# Patient Record
Sex: Male | Born: 2000 | Race: Black or African American | Hispanic: No | Marital: Single | Smoking: Never smoker
Health system: Southern US, Community
[De-identification: ages and names within clinical notes are randomized; demographics above are authoritative.]

## PROBLEM LIST (undated history)

## (undated) DIAGNOSIS — J45909 Unspecified asthma, uncomplicated: Secondary | ICD-10-CM

## (undated) DIAGNOSIS — Z91010 Allergy to peanuts: Secondary | ICD-10-CM

## (undated) DIAGNOSIS — J302 Other seasonal allergic rhinitis: Secondary | ICD-10-CM

## (undated) HISTORY — PX: TONSILLECTOMY: SUR1361

## (undated) HISTORY — PX: TYMPANOSTOMY TUBE PLACEMENT: SHX32

## (undated) HISTORY — DX: Allergy to peanuts: Z91.010

---
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
## 2019-01-02 ENCOUNTER — Ambulatory Visit: Payer: Medicaid Other | Attending: Internal Medicine

## 2019-01-02 DIAGNOSIS — Z20822 Contact with and (suspected) exposure to covid-19: Secondary | ICD-10-CM

## 2019-01-03 LAB — NOVEL CORONAVIRUS, NAA: SARS-CoV-2, NAA: NOT DETECTED

## 2019-02-07 ENCOUNTER — Ambulatory Visit: Payer: Medicaid Other | Attending: Internal Medicine

## 2019-02-07 DIAGNOSIS — Z20822 Contact with and (suspected) exposure to covid-19: Secondary | ICD-10-CM

## 2019-02-08 LAB — NOVEL CORONAVIRUS, NAA: SARS-CoV-2, NAA: NOT DETECTED

## 2019-02-13 ENCOUNTER — Ambulatory Visit: Payer: Medicaid Other | Attending: Internal Medicine

## 2019-02-13 DIAGNOSIS — Z20822 Contact with and (suspected) exposure to covid-19: Secondary | ICD-10-CM

## 2019-02-14 LAB — NOVEL CORONAVIRUS, NAA: SARS-CoV-2, NAA: DETECTED — AB

## 2019-02-14 NOTE — Progress Notes (Signed)
Your test for COVID-19 was positive ("detected"), meaning that you were infected with the novel coronavirus and could give the germ to others.    Please continue isolation at home, for at least 10 days since the start of your fever/cough/breathlessness and until you have had 24 hours without fever (without taking a fever reducer) and with any cough/breathlessness improving. Use over-the-counter medications for symptoms.  If you have had no symptoms, but were exposed to someone who was positive for COVID-19, you will need to quarantine and self-isolate for 14 days from the date of exposure.    Please continue good preventive care measures, including:  frequent hand-washing, avoid touching your face, cover coughs/sneezes, stay out of crowds and keep a 6 foot distance from others.  Clean hard surfaces touched frequently with disinfectant cleaning products.   Please check in with your primary care provider about your positive test result.  Go to the nearest urgent care or ED for assessment if you have severe breathlessness or severe weakness/fatigue (ex needing new help getting out of bed or to the bathroom).  Members of your household will also need to quarantine for 14 days from the date of your positive test. You may be contacted to discuss possible treatment options, and you may also be contacted by the health department for follow up. Please call [place] at [phone] if you have any questions or concerns.     

## 2019-02-15 ENCOUNTER — Telehealth: Payer: Self-pay | Admitting: *Deleted

## 2019-02-15 ENCOUNTER — Telehealth: Payer: Self-pay | Admitting: Nurse Practitioner

## 2019-02-15 NOTE — Telephone Encounter (Signed)
Pt called about instructions for COVID quarantine; instructions given to pt: Marland Kitchen Instruct the patient to remain in self-quarantine until they meet the "Non-Test Criteria for Ending Self-Isolation". Non-Test Criteria for Ending Self-Isolation All persons with fever and respiratory symptoms should isolate themselves until ALL conditions listed below are met: - at least 10 days since symptoms onset - AND 3 consecutive days fever free without antipyretics (acetaminophen [Tylenol] or ibuprofen [Advil]) - AND improvement in respiratory symptoms he verbalized understanding.

## 2019-02-15 NOTE — Telephone Encounter (Signed)
Called to discuss with Edward Cervantes about Covid symptoms and the use of bamlanivimab, a monoclonal antibody infusion for those with mild to moderate Covid symptoms and at a high risk of hospitalization.    Unable to reach patient.   Per chart review Pt is not qualified for this infusion due to lack of identified risk factors and co-morbid conditions.   Patient Active Problem List   Diagnosis Date Noted  . Allergic conjunctivitis 11/01/2016  . Asthma with acute exacerbation 09/14/2014  . Allergic rhinitis 09/14/2014  . 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 Cousar

## 2019-04-22 ENCOUNTER — Other Ambulatory Visit: Payer: Self-pay | Admitting: Allergy and Immunology

## 2019-04-23 ENCOUNTER — Other Ambulatory Visit: Payer: Self-pay

## 2019-04-23 MED ORDER — OLOPATADINE HCL 0.2 % OP SOLN: 1.0000 [drp] | OPHTHALMIC | 2 refills | Status: DC

## 2019-04-24 ENCOUNTER — Telehealth: Payer: Self-pay | Admitting: *Deleted

## 2019-04-24 NOTE — Telephone Encounter (Signed)
Pharmacy sent fax stating that Edward Cervantes is now OTC and Medicaid does not cover anymore. Preferred alternatives are Cromolyn or generic Pataday. Please advise change in medication. Thank You.

## 2019-04-25 MED ORDER — OLOPATADINE HCL 0.2 % OP SOLN: 1.0000 [drp] | Freq: Two times a day (BID) | OPHTHALMIC | 5 refills | Status: DC | PRN

## 2019-04-25 NOTE — Telephone Encounter (Signed)
New prescription has been sent in. Called patient and informed of change in medication. Patient verbalized understanding.

## 2019-04-25 NOTE — Telephone Encounter (Signed)
generic Pataday

## 2019-04-25 NOTE — Addendum Note (Signed)
Addended by: Dollene Cleveland R on: 04/25/2019 09:19 AM   Modules accepted: Orders

## 2019-07-31 IMAGING — DX DG ABDOMEN 1V
1 series · 1 of 1 positions shown · non-contrast
Comparison: None.

CLINICAL DATA: Generalized abdominal pain and bloating over the
last 3 days.

EXAM:
ABDOMEN - 1 VIEW

[abdomen kub]
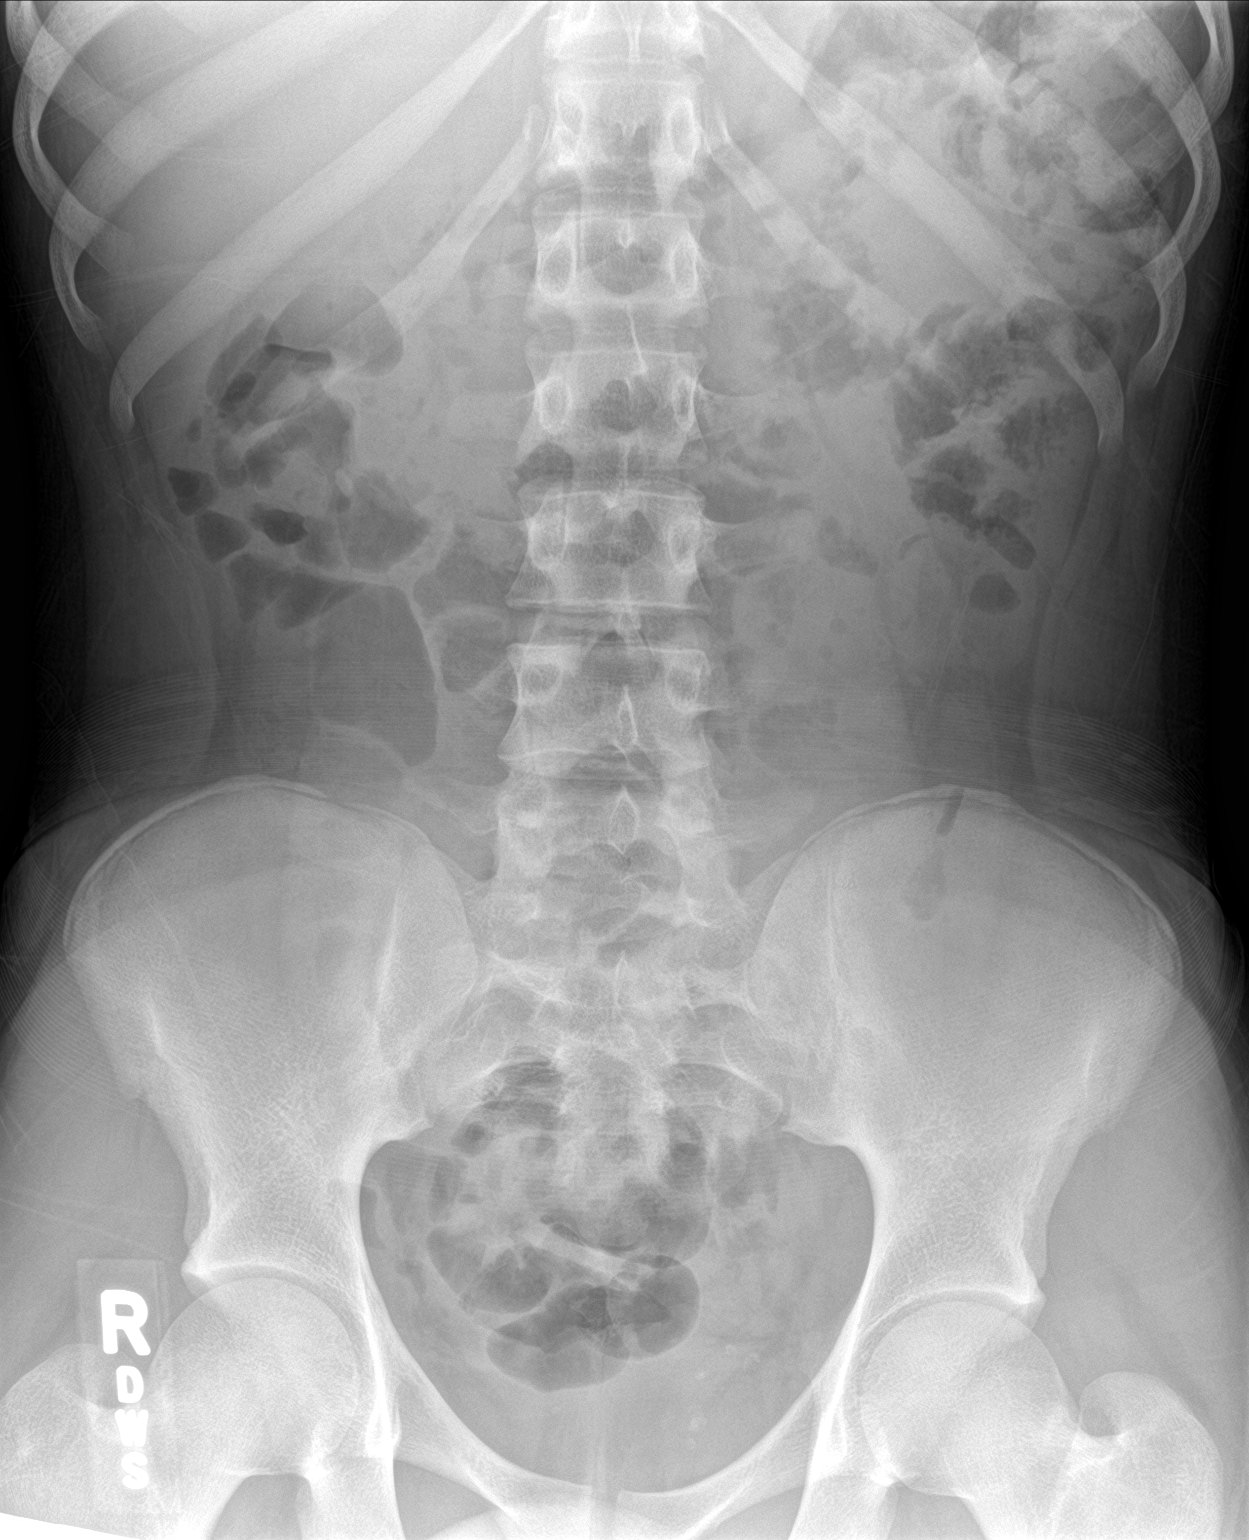

[1 of 1 positions shown; findings below may reference images not displayed]

FINDINGS: Bowel gas pattern is normal without evidence of increased gas or
stool. No obstructive pattern. No abnormal calcifications or bone
findings.
IMPRESSION: Within normal limits. Normal gas pattern. No increased stool volume.

## 2020-08-04 ENCOUNTER — Other Ambulatory Visit: Payer: Self-pay

## 2020-08-04 ENCOUNTER — Emergency Department (HOSPITAL_COMMUNITY)
Admission: EM | Admit: 2020-08-04 | Discharge: 2020-08-04 | Disposition: A | Discharge status: Home / Self Care | Payer: Medicaid Other | Attending: Emergency Medicine | Admitting: Emergency Medicine

## 2020-08-04 ENCOUNTER — Emergency Department (HOSPITAL_COMMUNITY): Payer: Medicaid Other

## 2020-08-04 ENCOUNTER — Encounter (HOSPITAL_COMMUNITY): Payer: Self-pay

## 2020-08-04 DIAGNOSIS — Z7951 Long term (current) use of inhaled steroids: Secondary | ICD-10-CM | POA: Diagnosis not present

## 2020-08-04 DIAGNOSIS — J45909 Unspecified asthma, uncomplicated: Secondary | ICD-10-CM | POA: Insufficient documentation

## 2020-08-04 DIAGNOSIS — Z91010 Allergy to peanuts: Secondary | ICD-10-CM | POA: Diagnosis not present

## 2020-08-04 DIAGNOSIS — G44209 Tension-type headache, unspecified, not intractable: Secondary | ICD-10-CM | POA: Insufficient documentation

## 2020-08-04 DIAGNOSIS — R519 Headache, unspecified: Secondary | ICD-10-CM | POA: Diagnosis present

## 2020-08-04 MED ORDER — PROCHLORPERAZINE MALEATE 10 MG PO TABS
10.0000 mg | ORAL_TABLET | Freq: Once | ORAL | Status: AC
  Administered 2020-08-04: 10 mg via ORAL
  Filled 2020-08-04: qty 1

## 2020-08-04 MED ORDER — DIPHENHYDRAMINE HCL 25 MG PO CAPS
25.0000 mg | ORAL_CAPSULE | Freq: Once | ORAL | Status: AC
  Administered 2020-08-04: 25 mg via ORAL
  Filled 2020-08-04: qty 1

## 2020-08-04 MED ORDER — KETOROLAC TROMETHAMINE 30 MG/ML IJ SOLN: 30.0000 mg | Freq: Once | INTRAMUSCULAR | Status: DC

## 2020-08-04 MED ORDER — KETOROLAC TROMETHAMINE 30 MG/ML IJ SOLN
30.0000 mg | Freq: Once | INTRAMUSCULAR | Status: AC
  Administered 2020-08-04: 30 mg via INTRAMUSCULAR
  Filled 2020-08-04: qty 1

## 2020-08-04 NOTE — ED Provider Notes (Signed)
[facility]-EMERGENCY DEPT Provider Note   CSN: [id number] Arrival date & time: 08/04/20  1455     History Chief Complaint  Patient presents with   Headache   Emesis    Edward Cervantes is a 20 y.o. male.  Presenting to ER with concern for headache.  Patient reports that around noon today he started having a headache as well as felt like his vision was somewhat blurry.  The vision complaint is gone away but still has had persistent headache.  Also had an episode where he had some abdominal pain and nausea and vomiting.  No further nausea, no ongoing abdominal pain at present.  Headache is currently 6 out of 10 in severity frontal, achy, nonradiating.  Not worst headache of his life, not sudden onset.    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 Authorizing Provider  albuterol (PROAIR HFA) 108 (90 Base) MCG/ACT inhaler INHALE 2 PUFFS EVERY 6 HOURS AS NEEDED FOR COUGH OR WHEEZE. 11/08/17   Kozlow, Alvira Philips, MD  cetirizine (ZYRTEC) 10 MG tablet TAKE 1 TABLET BY MOUTH EVERY DAY 12/04/18   Kozlow, Alvira Philips, MD  EPINEPHrine 0.3 mg/0.3 mL IJ SOAJ injection Use as directed for  life-threatening allergic reaction. 11/08/17   Kozlow, Alvira Philips, MD  FLOVENT HFA 44 MCG/ACT inhaler INHALE 3 PUFFS 3 TIMES PER DAY DURING ASTHMA FLARE. RINSE, GARGLE, SPIT AFTER USE. 04/23/19   Kozlow, Alvira Philips, MD  FOCALIN XR 20 MG 24 hr capsule Take 20 mg by mouth every morning. 01/20/15   [provider]  Multiple Vitamins-Minerals (DAILY MULTIVITAMIN PO) Take by mouth daily.    [provider]  Olopatadine HCl (PATADAY) 0.2 % SOLN Place 1 drop into both eyes 1 day or 1 dose. 04/23/19   Kozlow, Alvira Philips, MD  Olopatadine HCl 0.2 % SOLN Apply 1 drop to eye 2 (two) times daily as needed. 04/25/19   Kozlow, Alvira Philips, MD  Triamcinolone Acetonide (NASACORT AQ NA) Place 1 spray into the nose 3 (three) times a week.    [provider]    Allergies    Peanut-containing drug products  Review of Systems   Review of Systems  Physical Exam Updated Vital Signs BP 123/79   Pulse (!) 52   Temp 98.3 F (36.8 C) (Oral)   Resp 18   Ht 6\' 2"  (1.88 m)   Wt 74.8 kg   SpO2 98%   BMI 21.18 kg/m   Physical Exam Vitals  and nursing note reviewed.  Constitutional:      Appearance: He is well-developed.  HENT:     Head: Normocephalic and atraumatic.  Eyes:     Conjunctiva/sclera: Conjunctivae normal.  Cardiovascular:     Rate and Rhythm: Normal rate and regular rhythm.     Heart sounds: No murmur heard. Pulmonary:     Effort: Pulmonary effort is normal. No respiratory distress.  Abdominal:     Palpations: Abdomen is soft.     Tenderness: There is no abdominal tenderness.  Musculoskeletal:     Cervical back: Neck supple.  Skin:    General: Skin is warm and dry.  Neurological:     Mental Status: He is alert.     Comments: AAOx3 CN 2-12 intact, speech clear visual fields intact 5/5 strength in b/l UE and LE Sensation to light touch intact in b/l UE and LE Normal FNF Normal gait  Psychiatric:        Mood and Affect: Mood normal.    ED Results / Procedures / Treatments    Labs (all labs ordered are listed, but only abnormal results are displayed) Labs Reviewed - No data to display  EKG None  Radiology CT HEAD WO CONTRAST ( )  Result Date: 08/04/2020 CLINICAL DATA:  Headache EXAM: CT HEAD WITHOUT CONTRAST TECHNIQUE: Contiguous axial images were obtained from the base of the skull through the vertex without intravenous contrast. COMPARISON:  None. FINDINGS: Brain: There is no mass, hemorrhage or extra-axial collection. The size and configuration of the ventricles and extra-axial CSF spaces are normal. The brain parenchyma is normal, without acute or chronic infarction. Vascular: No abnormal hyperdensity of the major intracranial arteries or dural venous sinuses. No intracranial atherosclerosis. Skull: The visualized skull base, calvarium and extracranial soft tissues are normal. Sinuses/Orbits: No fluid levels or advanced mucosal thickening of the visualized paranasal sinuses. No mastoid or middle ear effusion. The orbits are normal. IMPRESSION: Normal head CT. Electronically Signed   By: Deatra Robinson M.D.   On: 08/04/2020 20:57    Procedures Procedures   Medications Ordered in ED Medications  diphenhydrAMINE (BENADRYL) capsule 25 mg (has no administration in time range)  prochlorperazine (COMPAZINE) tablet 10 mg (has no administration in time range)  ketorolac (TORADOL) 30 MG/ML injection 30 mg (has no administration in time range)    ED Course  I have reviewed the triage vital signs and the nursing notes.  Pertinent labs & imaging results that were available during my care of the patient were reviewed by me and considered in my medical decision making (see chart for details).    MDM Rules/Calculators/A&P                           20 year old male presents to ER with concern for new onset headache.  On exam he appears well in no distress.  Normal neuro exam.  Not sudden onset but given patient does not frequently come to ER and his reported  transient visual disturbance, and transient nausea, check CT head.  Negative for acute pathology.  On reassessment he remains well-appearing.  Suspect tension type headache versus migraine.  Reviewed return precautions and discharged.    After the discussed management above, the patient was determined to be safe for discharge.  The patient was in agreement with this plan and all questions regarding their care were answered.  ED return precautions were discussed and the patient will return to the ED with any  significant worsening of condition.  Final Clinical Impression(s) / ED Diagnoses Final diagnoses:  Tension-type headache, not intractable, unspecified chronicity pattern    Rx / DC Orders ED Discharge Orders     None        Milagros Loll, MD 08/04/20 2111

## 2020-08-04 NOTE — ED Provider Notes (Signed)
Emergency Medicine Provider Triage Evaluation Note  Edward Cervantes , a 20 y.o. male  was evaluated in triage.  Pt complains of headache that started at 12 today. Associated with nv. Has hx headaches.  Review of Systems  Positive: Headache, nv Negative: Numbness/weakness  Physical Exam  BP 124/87 (BP Location: Left Arm)   Pulse (!) 58   Temp 98.1 F (36.7 C) (Oral)   Resp 18   Ht 6\' 2"  (1.88 m)   Wt 74.8 kg   SpO2 100%   BMI 21.18 kg/m  Gen:   Awake, no distress   Resp:  Normal effort  MSK:   Moves extremities without difficulty  Other:  Cn II-XII intact. 5/5 strength to bue/ble  Medical Decision Making  Medically screening exam initiated at 4:16 PM.  Appropriate orders placed.  was informed that the remainder of the evaluation will be completed by another provider, this initial triage assessment does not replace that evaluation, and the importance of remaining in the ED until their evaluation is complete.     [name], PA-C 08/04/20 1619    10/04/20, MD 08/04/20 10/04/20

## 2020-08-04 NOTE — ED Triage Notes (Signed)
Pt reports headache that started today at 12 noon. Pt reports taking tylenol. After taking medication pt reports his stomach started to hurt. Pt reports vomiting x3.   Pt reports his stomach is not hurting right now.

## 2020-08-04 NOTE — Discharge Instructions (Addendum)
Recommend Tylenol and Motrin as needed for pain control.  Follow-up with your primary care doctor.  Come back to ER if you develop significant worsening of your headache, vomiting, fever, neck pain or stiffness or other new concerning symptoms.

## 2020-12-23 ENCOUNTER — Emergency Department (HOSPITAL_COMMUNITY)
Admission: EM | Admit: 2020-12-23 | Discharge: 2020-12-23 | Disposition: A | Discharge status: Home / Self Care | Payer: Medicaid Other | Attending: Emergency Medicine | Admitting: Emergency Medicine

## 2020-12-23 ENCOUNTER — Encounter (HOSPITAL_COMMUNITY): Payer: Self-pay

## 2020-12-23 ENCOUNTER — Other Ambulatory Visit: Payer: Self-pay

## 2020-12-23 DIAGNOSIS — Z20822 Contact with and (suspected) exposure to covid-19: Secondary | ICD-10-CM | POA: Diagnosis not present

## 2020-12-23 DIAGNOSIS — J101 Influenza due to other identified influenza virus with other respiratory manifestations: Secondary | ICD-10-CM | POA: Diagnosis not present

## 2020-12-23 DIAGNOSIS — J111 Influenza due to unidentified influenza virus with other respiratory manifestations: Secondary | ICD-10-CM

## 2020-12-23 DIAGNOSIS — Z7951 Long term (current) use of inhaled steroids: Secondary | ICD-10-CM | POA: Insufficient documentation

## 2020-12-23 DIAGNOSIS — Z91010 Allergy to peanuts: Secondary | ICD-10-CM | POA: Insufficient documentation

## 2020-12-23 DIAGNOSIS — J45909 Unspecified asthma, uncomplicated: Secondary | ICD-10-CM | POA: Diagnosis not present

## 2020-12-23 DIAGNOSIS — R519 Headache, unspecified: Secondary | ICD-10-CM | POA: Diagnosis present

## 2020-12-23 LAB — RESP PANEL BY RT-PCR (FLU A&B, COVID) ARPGX2
Influenza A by PCR: POSITIVE — AB
Influenza B by PCR: NEGATIVE
SARS Coronavirus 2 by RT PCR: NEGATIVE

## 2020-12-23 MED ORDER — IBUPROFEN 800 MG PO TABS
800.0000 mg | ORAL_TABLET | Freq: Once | ORAL | Status: AC
  Administered 2020-12-23: 21:00:00 800 mg via ORAL
  Filled 2020-12-23: qty 1

## 2020-12-23 MED ORDER — BENZONATATE 100 MG PO CAPS: 100.0000 mg | ORAL_CAPSULE | Freq: Three times a day (TID) | ORAL | 0 refills | Status: DC

## 2020-12-23 MED ORDER — FLUTICASONE PROPIONATE 50 MCG/ACT NA SUSP: 2.0000 | Freq: Every day | NASAL | 0 refills | Status: DC

## 2020-12-23 MED ORDER — NAPROXEN 500 MG PO TABS: 500.0000 mg | ORAL_TABLET | Freq: Two times a day (BID) | ORAL | 0 refills | Status: DC

## 2020-12-23 MED ORDER — OSELTAMIVIR PHOSPHATE 75 MG PO CAPS: 75.0000 mg | ORAL_CAPSULE | Freq: Two times a day (BID) | ORAL | 0 refills | Status: DC

## 2020-12-23 NOTE — ED Triage Notes (Signed)
Pt reports with headache and generalized body aches since today.

## 2020-12-23 NOTE — ED Provider Notes (Signed)
[facility]-EMERGENCY DEPT Provider Note   CSN: [id number] Arrival date & time: 12/23/20  1922     History Chief Complaint  Patient presents with   Headache   Generalized Body Aches    Edward Cervantes is a 20 y.o. male with no significant past medical history who presents for evaluation of myalgias, headache and rhinorrhea.  Began this morning.  No known sick contacts.  No recent head trauma.  Feels like he is "aching" all over.  No congestion.  Has some clear rhinorrhea.  No chest pain, shortness of breath, cough abdominal pain, nausea, vomiting.  No neck stiffness or neck rigidity.  Has not taken anything for symptoms.  Denies additional aggravating or alleviating factors.  History obtained from patient and past medical records.  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 Authorizing Provider  benzonatate (TESSALON) 100 MG capsule Take 1 capsule (100 mg total) by mouth every 8 (eight) hours. 12/23/20  Yes Raimundo Corbit A, PA-C  fluticasone (FLONASE) 50 MCG/ACT nasal spray Place 2 sprays into both nostrils daily. 12/23/20  Yes Joellen Tullos,  Harriette Tovey A, PA-C  naproxen (NAPROSYN) 500 MG tablet Take 1 tablet (500 mg total) by mouth 2 (two) times daily. 12/23/20  Yes Darrold Bezek A, PA-C  albuterol (PROAIR HFA) 108 (90 Base) MCG/ACT inhaler INHALE 2 PUFFS EVERY 6 HOURS AS NEEDED FOR COUGH OR WHEEZE. 11/08/17   Kozlow, Alvira Philips, MD  cetirizine (ZYRTEC) 10 MG tablet TAKE 1 TABLET BY MOUTH EVERY DAY 12/04/18   Kozlow, Alvira Philips, MD  EPINEPHrine 0.3 mg/0.3 mL IJ SOAJ injection Use as directed for life-threatening allergic reaction. 11/08/17   Kozlow, Alvira Philips, MD  FLOVENT HFA 44 MCG/ACT inhaler INHALE 3 PUFFS 3 TIMES PER DAY DURING ASTHMA FLARE. RINSE, GARGLE, SPIT AFTER USE. 04/23/19   Kozlow, Alvira Philips, MD  FOCALIN XR 20 MG 24 hr capsule Take 20 mg by mouth every morning. 01/20/15   [provider]  Multiple Vitamins-Minerals (DAILY MULTIVITAMIN PO) Take by mouth daily.    [provider]  Olopatadine HCl (PATADAY) 0.2 % SOLN Place 1 drop into both eyes 1 day or 1 dose. 04/23/19   Kozlow, Alvira Philips, MD  Olopatadine HCl 0.2 % SOLN Apply 1 drop to eye 2 (two) times daily as needed. 04/25/19   Kozlow, Alvira Philips, MD  Triamcinolone Acetonide (NASACORT AQ NA) Place 1 spray into the nose 3 (three) times a week.    [provider]    Allergies    Peanut-containing drug products  Review of Systems   Review of Systems  Constitutional:  Positive for activity change, chills, fatigue and fever.  HENT:  Positive for postnasal drip and rhinorrhea. Negative for congestion, sinus pressure, sinus pain, sore throat, trouble swallowing and voice change.   Respiratory: Negative.    Cardiovascular: Negative.   Gastrointestinal: Negative.   Musculoskeletal:  Positive for myalgias.  Skin: Negative.   Neurological:  Positive for headaches. Negative for dizziness, seizures, syncope, speech difficulty, weakness, light-headedness and numbness.  All other systems reviewed and are negative.  Physical Exam Updated Vital Signs BP 109/65 (BP Location:  Left Arm)    Pulse (!) 103    Temp 100.2 F (37.9 C) (Oral)    Resp 17    Ht 6\' 2"  (1.88 m)    Wt 75.3 kg    SpO2 99%    BMI 21.31 kg/m   Physical Exam Vitals and nursing note reviewed.  Constitutional:      General: He is not in acute distress.    Appearance: He is not ill-appearing, toxic-appearing or diaphoretic.  HENT:     Head: Normocephalic and atraumatic.     Jaw: There is normal jaw occlusion.     Right Ear: Tympanic membrane, ear canal and external ear normal. There is no impacted cerumen. No hemotympanum. Tympanic membrane is not injected, scarred, perforated, erythematous, retracted or bulging.     Left Ear: Tympanic membrane, ear canal and external ear normal. There is no impacted cerumen. No hemotympanum. Tympanic membrane is not injected, scarred, perforated, erythematous, retracted or bulging.     Ears:     Comments: No Mastoid tenderness.    Nose:     Comments: Clear rhinorrhea and congestion to bilateral nares.  No sinus tenderness.    Mouth/Throat:     Comments: Posterior oropharynx clear.  Mucous membranes moist.  Tonsils without erythema or exudate.  Uvula midline without deviation.  No evidence of PTA or RPA.  No drooling, dysphasia or trismus.  Phonation normal. Neck:     Trachea: Trachea and phonation normal.     Meningeal: Brudzinski's sign and Kernig's sign absent.     Comments: No Neck stiffness or neck rigidity.  No meningismus.  No cervical lymphadenopathy. Cardiovascular:     Comments: No murmurs rubs or gallops. Pulmonary:     Comments: Clear to auscultation bilaterally without wheeze, rhonchi or rales.  No accessory muscle usage.  Able speak in full sentences. Abdominal:     Comments: Soft, nontender without rebound or guarding.  No CVA tenderness.  Musculoskeletal:     Comments: Moves all 4 extremities without difficulty.  Lower extremities without edema, erythema or warmth.  Skin:    Comments: Brisk capillary refill.  No rashes or lesions.   Neurological:     Mental Status: He is alert.     Comments: Ambulatory in department without difficulty.  Cranial nerves II through XII grossly intact.  No facial droop.  No aphasia.    ED Results / Procedures / Treatments   Labs (all labs ordered are listed, but only abnormal results are displayed) Labs Reviewed  RESP PANEL BY RT-PCR (FLU A&B, COVID) ARPGX2 - Abnormal; Notable for the following components:      Result Value   Influenza A by PCR POSITIVE (*)    All other components within normal limits    EKG None  Radiology No results found.  Procedures Procedures  Medications Ordered in ED Medications  ibuprofen (ADVIL) tablet 800 mg (800 mg Oral Given 12/23/20 2038)    ED Course  I have reviewed the triage vital signs and the nursing notes.  Pertinent labs & imaging results that were available during my care of the patient were reviewed by me and considered in my medical decision making (see chart for details).  Pleasant 20 year old here for evaluation of viral symptoms.  On arrival febrile however does not appear septic or ill.  He has nonfocal neuro exam without deficit.  No neck stiffness or neck rigidity.  He has no meningismus.  He appears clinically well-hydrated. Tolerating PO intake. PO clear. His heart and lungs are clear.  Abdomen soft, nontender.   Influenza A positive  Likely cause of his symptoms.  DC home symptomatic management  The patient has been appropriately medically screened and/or stabilized in the ED. I have low suspicion for any other emergent medical condition which would require further screening, evaluation or treatment in the ED or require inpatient management.  Patient is hemodynamically stable and in no acute distress.  Patient able to ambulate in department prior to ED.  Evaluation does not show acute pathology that would require ongoing or additional emergent interventions while in the emergency department or further inpatient  treatment.  I have discussed the diagnosis with the patient and answered all questions.  Pain is been managed while in the emergency department and patient has no further complaints prior to discharge.  Patient is comfortable with plan discussed in room and is stable for discharge at this time.  I have discussed strict return precautions for returning to the emergency department.  Patient was encouraged to follow-up with PCP/specialist refer to at discharge.     MDM Rules/Calculators/A&P                             Final Clinical Impression(s) / ED Diagnoses Final diagnoses:  Influenza    Rx / DC Orders ED Discharge Orders          Ordered    naproxen (NAPROSYN) 500 MG tablet  2 times daily        12/23/20 2108    benzonatate (TESSALON) 100 MG capsule  Every 8 hours        12/23/20 2108    fluticasone (FLONASE) 50 MCG/ACT nasal spray  Daily        12/23/20 2108             Lenny Bouchillon A, PA-C 12/23/20 2114    Pollyann Savoy, MD 12/23/20 2202

## 2020-12-23 NOTE — Discharge Instructions (Addendum)
Your flu test was positive.  I have written you for a few medications to help with your symptoms  Naproxen which will help with your fever as well as your body aches and headache Flonase if you developing runny nose Tessalon Pearls if you develop a cough May also take additional Tylenol  Need to be out of work until you are 48 hours without fever  Return for new or worsening symptoms

## 2020-12-23 NOTE — ED Notes (Signed)
Pt A&OX4 ambulatory at d/c with independent steady gait, NAD. Pt verbalized understanding of d/c instructions, prescriptions and follow up care. 

## 2021-07-27 ENCOUNTER — Other Ambulatory Visit: Payer: Self-pay

## 2021-07-27 ENCOUNTER — Emergency Department (HOSPITAL_COMMUNITY)
Admission: EM | Admit: 2021-07-27 | Discharge: 2021-07-27 | Disposition: A | Discharge status: Home / Self Care | Payer: Medicaid Other | Attending: Emergency Medicine | Admitting: Emergency Medicine

## 2021-07-27 ENCOUNTER — Encounter (HOSPITAL_COMMUNITY): Payer: Self-pay

## 2021-07-27 DIAGNOSIS — X04XXXA Exposure to ignition of highly flammable material, initial encounter: Secondary | ICD-10-CM | POA: Diagnosis not present

## 2021-07-27 DIAGNOSIS — T24102A Burn of first degree of unspecified site of left lower limb, except ankle and foot, initial encounter: Secondary | ICD-10-CM | POA: Insufficient documentation

## 2021-07-27 DIAGNOSIS — T310 Burns involving less than 10% of body surface: Secondary | ICD-10-CM | POA: Diagnosis not present

## 2021-07-27 DIAGNOSIS — T24231A Burn of second degree of right lower leg, initial encounter: Secondary | ICD-10-CM | POA: Diagnosis present

## 2021-07-27 DIAGNOSIS — T24201A Burn of second degree of unspecified site of right lower limb, except ankle and foot, initial encounter: Secondary | ICD-10-CM

## 2021-07-27 MED ORDER — SILVER SULFADIAZINE 1 % EX CREA
TOPICAL_CREAM | Freq: Once | CUTANEOUS | Status: AC
  Filled 2021-07-27: qty 50
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
## 2021-07-27 NOTE — ED Notes (Signed)
I provided reinforced discharge education based off of discharge summary/care provided. Pt acknowledged and understood my education. Pt had no further questions/concerns for provider/myself.   
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
## 2021-11-28 ENCOUNTER — Emergency Department (HOSPITAL_COMMUNITY): Payer: Medicaid Other

## 2021-11-28 ENCOUNTER — Emergency Department (HOSPITAL_COMMUNITY)
Admission: EM | Admit: 2021-11-28 | Discharge: 2021-11-28 | Disposition: A | Discharge status: Home / Self Care | Payer: Medicaid Other | Attending: Student | Admitting: Student

## 2021-11-28 DIAGNOSIS — Y92410 Unspecified street and highway as the place of occurrence of the external cause: Secondary | ICD-10-CM | POA: Insufficient documentation

## 2021-11-28 DIAGNOSIS — R519 Headache, unspecified: Secondary | ICD-10-CM | POA: Diagnosis not present

## 2021-11-28 DIAGNOSIS — M25561 Pain in right knee: Secondary | ICD-10-CM | POA: Diagnosis present

## 2021-11-28 DIAGNOSIS — R0781 Pleurodynia: Secondary | ICD-10-CM | POA: Insufficient documentation

## 2021-11-28 MED ORDER — IBUPROFEN 800 MG PO TABS
800.0000 mg | ORAL_TABLET | Freq: Once | ORAL | Status: AC
  Administered 2021-11-28: 800 mg via ORAL
  Filled 2021-11-28: qty 1

## 2021-11-28 MED ORDER — METHOCARBAMOL 500 MG PO TABS: 500.0000 mg | ORAL_TABLET | Freq: Two times a day (BID) | ORAL | 0 refills | Status: DC

## 2021-11-28 MED ORDER — CYCLOBENZAPRINE HCL 10 MG PO TABS
10.0000 mg | ORAL_TABLET | Freq: Once | ORAL | Status: AC
  Administered 2021-11-28: 10 mg via ORAL
  Filled 2021-11-28: qty 1

## 2021-11-28 NOTE — Discharge Instructions (Signed)
Robaxin as needed as prescribed for muscle aches.  Do not drive or operate machinery while taking this medication.  Can take Motrin and Tylenol as needed as directed for pain as well.  Can apply warm compresses to sore muscles for 20 minutes at a time and follow with gentle stretching.  Also recommend topical lidocaine patches as needed, these are available over-the-counter.  Follow-up with your primary care provider for recheck if pain is not improving in the next 3 to 5 days.
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
## 2021-11-28 NOTE — ED Notes (Signed)
Patient verbalizes understanding of discharge instructions. Opportunity for questioning and answers were provided. Armband removed by staff, pt discharged from ED. Ambulated out to lobby with mother  

## 2022-02-01 ENCOUNTER — Ambulatory Visit (HOSPITAL_COMMUNITY)
Admission: EM | Admit: 2022-02-01 | Discharge: 2022-02-01 | Disposition: A | Discharge status: Home / Self Care | Payer: Medicaid Other | Attending: Internal Medicine | Admitting: Internal Medicine

## 2022-02-01 ENCOUNTER — Encounter (HOSPITAL_COMMUNITY): Payer: Self-pay | Admitting: Emergency Medicine

## 2022-02-01 DIAGNOSIS — J069 Acute upper respiratory infection, unspecified: Secondary | ICD-10-CM

## 2022-02-01 MED ORDER — IBUPROFEN 600 MG PO TABS: 600.0000 mg | ORAL_TABLET | Freq: Four times a day (QID) | ORAL | 0 refills | Status: AC | PRN

## 2022-02-01 MED ORDER — FLUTICASONE PROPIONATE 50 MCG/ACT NA SUSP: 1.0000 | Freq: Every day | NASAL | 0 refills | Status: AC

## 2022-02-01 NOTE — ED Triage Notes (Signed)
Pt reports took two flu shots (had one in November in [employer]) and then again last Thursday had another flu shot since couldn't get records for a job. Since Thursday had body aches, cough, congestion., sneeze, sinus pressure. Took tylenol and nyquil, drinking tea.

## 2022-02-01 NOTE — Discharge Instructions (Addendum)
Please maintain adequate hydration Take medications as prescribed No indication for COVID testing given the duration of your symptoms Please return to urgent care if you have any other concerns.

## 2022-02-01 NOTE — ED Provider Notes (Signed)
[place]    CSN: [id number] Arrival date & time: 02/01/22  6283      History   Chief Complaint No chief complaint on file.   HPI Edward Cervantes is a 22 y.o. male comes to urgent care for persistent nasal congestion and postnasal drainage after he received a flu shot.  Patient has had 2 flu shots separated by 3 weeks recently.  Following the second shot he developed nasal congestion, headache, generalized body aches, subjective fever and chills.  Patient also had cough with postnasal drainage.  Most of his symptoms except nasal congestion and postnasal drainage have subsided.  He has been taking over-the-counter medication and maintaining adequate hydration to help with symptoms.  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 Authorizing Provider  fluticasone (FLONASE) 50 MCG/ACT nasal spray Place 1 spray into both nostrils daily. 02/01/22  Yes Nthony Lefferts, Myrene Galas, MD  ibuprofen (ADVIL) 600 MG tablet Take 1 tablet (600 mg total) by mouth every 6 (six) hours as needed. 02/01/22  Yes Toddrick Sanna, Myrene Galas, MD  albuterol (PROAIR HFA) 108 (90 Base) MCG/ACT inhaler INHALE 2 PUFFS EVERY 6 HOURS AS NEEDED FOR COUGH OR WHEEZE. 11/08/17   Kozlow, Donnamarie Poag, MD  cetirizine (ZYRTEC) 10 MG tablet TAKE 1 TABLET BY MOUTH EVERY DAY 12/04/18   Kozlow, Donnamarie Poag, MD  EPINEPHrine 0.3 mg/0.3 mL IJ SOAJ injection Use as directed for life-threatening allergic reaction.  11/08/17   Kozlow, Donnamarie Poag, MD  FLOVENT HFA 44 MCG/ACT inhaler INHALE 3 PUFFS 3 TIMES PER DAY DURING ASTHMA FLARE. RINSE, GARGLE, SPIT AFTER USE. 04/23/19   Kozlow, Donnamarie Poag, MD  FOCALIN XR 20 MG 24 hr capsule Take 20 mg by mouth every morning. 01/20/15   [provider]  Multiple Vitamins-Minerals (DAILY MULTIVITAMIN PO) Take by mouth daily.    [provider]    Family History Family History  Problem Relation Age of Onset   Hypertension Other    Diabetes Other    Asthma Other    Healthy Father     Social History Social History   Tobacco Use   Smoking status: Never   Smokeless tobacco: Never  Vaping Use   Vaping Use: Never used  Substance Use Topics   Alcohol use: No   Drug use: No     Allergies   Peanut-containing drug products   Review of Systems Review of Systems As per HPI  Physical Exam Triage Vital Signs ED Triage Vitals  Enc Vitals Group     BP 02/01/22 1047 102/71     Pulse Rate 02/01/22 1047 67     Resp 02/01/22 1047 14     Temp 02/01/22 1047 97.7 F (36.5 C)     Temp Source 02/01/22 1047 Oral     SpO2 02/01/22 1047 97 %     Weight --  Height --      Head Circumference --      Peak Flow --      Pain Score 02/01/22 1046 4     Pain Loc --      Pain Edu? --      Excl. in [place]? --    No data found.  Updated Vital Signs BP 102/71 (BP Location: Left Arm)   Pulse 67   Temp 97.7 F (36.5 C) (Oral)   Resp 14   SpO2 97%   Visual Acuity Right Eye Distance:   Left Eye Distance:   Bilateral Distance:    Right Eye Near:   Left Eye Near:    Bilateral Near:     Physical Exam Vitals and nursing note reviewed.  Constitutional:      Appearance: He is not ill-appearing.  HENT:     Right Ear: Tympanic membrane normal.     Left Ear: Tympanic membrane normal.     Mouth/Throat:     Mouth: Mucous membranes are moist.     Pharynx: No posterior oropharyngeal erythema.  Cardiovascular:     Rate and Rhythm: Normal rate and regular  rhythm.     Pulses: Normal pulses.     Heart sounds: Normal heart sounds.  Pulmonary:     Effort: Pulmonary effort is normal.     Breath sounds: Normal breath sounds.  Neurological:     Mental Status: He is alert.      UC Treatments / Results  Labs (all labs ordered are listed, but only abnormal results are displayed) Labs Reviewed - No data to display  EKG   Radiology No results found.  Procedures Procedures (including critical care time)  Medications Ordered in UC Medications - No data to display  Initial Impression / Assessment and Plan / UC Course  I have reviewed the triage vital signs and the nursing notes.  Pertinent labs & imaging results that were available during my care of the patient were reviewed by me and considered in my medical decision making (see chart for details).     1.  Viral URI with cough: COVID and flu test not recommended given the duration of symptoms Patient is advised to increase oral fluid intake Fluticasone nasal spray nightly Please return to urgent care if you have persistent symptoms. Final Clinical Impressions(s) / UC Diagnoses   Final diagnoses:  Viral URI with cough     Discharge Instructions      Please maintain adequate hydration Take medications as prescribed No indication for COVID testing given the duration of your symptoms Please return to urgent care if you have any other concerns.   ED Prescriptions     Medication Sig Dispense Auth. Provider   fluticasone (FLONASE) 50 MCG/ACT nasal spray Place 1 spray into both nostrils daily. 16 g Chase Picket, MD   ibuprofen (ADVIL) 600 MG tablet Take 1 tablet (600 mg total) by mouth every 6 (six) hours as needed. 30 tablet Tallan Sandoz, Myrene Galas, MD      PDMP not reviewed this encounter.   Chase Picket, MD 02/01/22 1116

## 2022-08-18 ENCOUNTER — Other Ambulatory Visit: Payer: Self-pay

## 2022-08-18 MED ORDER — DOXYCYCLINE HYCLATE 100 MG PO TABS
100.0000 mg | ORAL_TABLET | Freq: Two times a day (BID) | ORAL | 0 refills | Status: DC
  Filled 2022-08-18: qty 14, 7d supply, fill #0

## 2023-03-24 ENCOUNTER — Encounter (HOSPITAL_COMMUNITY): Payer: Self-pay | Admitting: Emergency Medicine

## 2023-03-24 ENCOUNTER — Other Ambulatory Visit: Payer: Self-pay

## 2023-03-24 ENCOUNTER — Emergency Department (HOSPITAL_COMMUNITY)
Admission: EM | Admit: 2023-03-24 | Discharge: 2023-03-24 | Discharge status: Against Medical Advice | Attending: Emergency Medicine | Admitting: Emergency Medicine

## 2023-03-24 DIAGNOSIS — L989 Disorder of the skin and subcutaneous tissue, unspecified: Secondary | ICD-10-CM | POA: Diagnosis present

## 2023-03-24 DIAGNOSIS — Z91010 Allergy to peanuts: Secondary | ICD-10-CM | POA: Insufficient documentation

## 2023-03-24 MED ORDER — ACETAMINOPHEN 500 MG PO TABS: 1000.0000 mg | ORAL_TABLET | Freq: Once | ORAL | Status: DC

## 2023-03-24 MED ORDER — DOXYCYCLINE HYCLATE 100 MG PO CAPS: 100.0000 mg | ORAL_CAPSULE | Freq: Two times a day (BID) | ORAL | 0 refills | Status: AC

## 2023-03-24 MED ORDER — IBUPROFEN 400 MG PO TABS: 600.0000 mg | ORAL_TABLET | Freq: Once | ORAL | Status: DC

## 2023-03-24 NOTE — Discharge Instructions (Addendum)
 The areas on your body looks like they could have been insect bites or scratches that have gotten infected.  I have sent in doxycycline to help treat this infection.  Be sure to follow-up with your primary doctor if your symptoms are worsening despite the antibiotics.
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
## 2023-03-24 NOTE — ED Triage Notes (Signed)
 Pt c/o possible abscess or insect bite to left shoulder and abdomen, denies fevers

## 2023-03-24 NOTE — ED Provider Notes (Signed)
 [place] EMERGENCY DEPARTMENT AT [facility] Provider Note   CSN: [id number] Arrival date & time: 03/24/23  0547     History  Chief Complaint  Patient presents with   Abscess    Edward Cervantes is a 23 y.o. male.  Patient here with possible abscess or insect bite to the left shoulder and abdomen.  Lesions been present for about 1 week now.  He woke up with them and they were itchy.  He had been scratching them.  Over time, the areas got more swollen and painful.  They are not healing like they should.  Lesion on his left shoulder had pus in it 2 days ago.  No fevers, nausea, vomiting, bowel changes, dysuria, chest pain, shortness of breath, recent travel.  He is sexually active with his girlfriend only.  He does have a history of abscess formation on his arm which required drainage and antibiotics in the past.  He was told that this was due to him shaving.  No known personal or family history of immunosuppression.    Home Medications Prior to Admission medications   Medication Sig Start Date End Date Taking? Authorizing Provider  doxycycline (VIBRAMYCIN) 100 MG capsule Take 1 capsule (100 mg total) by mouth 2 (two) times daily for 7 days. 03/24/23 03/31/23 Yes Irean Kendricks, Earvin Hansen, MD  albuterol (PROAIR HFA) 108 (90 Base) MCG/ACT inhaler INHALE 2 PUFFS EVERY 6 HOURS AS NEEDED FOR COUGH OR WHEEZE. 11/08/17   Kozlow, Alvira Philips, MD  cetirizine (ZYRTEC) 10 MG tablet TAKE 1 TABLET BY MOUTH EVERY DAY 12/04/18   Kozlow, Alvira Philips, MD  EPINEPHrine 0.3 mg/0.3 mL IJ SOAJ injection Use as directed for life-threatening allergic reaction. 11/08/17   Kozlow, Alvira Philips, MD  FLOVENT HFA 44 MCG/ACT inhaler INHALE 3 PUFFS 3 TIMES PER DAY DURING ASTHMA FLARE. RINSE, GARGLE, SPIT AFTER USE. 04/23/19   Kozlow, Alvira Philips, MD  fluticasone (FLONASE) 50 MCG/ACT nasal spray Place 1 spray into both nostrils daily. 02/01/22   Lamptey, Britta Mccreedy, MD  FOCALIN XR 20 MG 24 hr capsule Take 20 mg by mouth every morning. 01/20/15    [provider]  ibuprofen (ADVIL) 600 MG tablet Take 1 tablet (600 mg total) by mouth every 6 (six) hours as needed. 02/01/22   LampteyBritta Mccreedy, MD  Multiple Vitamins-Minerals (DAILY MULTIVITAMIN PO) Take by mouth daily.    [provider]      Allergies    Peanut-containing drug products    Review of Systems   Review of Systems  Constitutional:  Negative for fever.  Skin:  Positive for color change, rash and wound.    Physical Exam Updated Vital Signs BP 123/80 (BP Location: Left Arm)   Pulse 77   Temp 99.1 F (37.3 C)   Resp 18   Ht 6\' 2"  (1.88 m)   Wt 75.3 kg   SpO2 100%   BMI 21.31 kg/m  Physical Exam Constitutional:      General: He is not in acute distress.    Appearance: Normal appearance. He is normal weight. He is not ill-appearing.  HENT:     Head: Normocephalic and atraumatic.     Nose: Nose normal.     Mouth/Throat:     Mouth: Mucous membranes are moist.  Eyes:     Extraocular Movements: Extraocular movements intact.     Conjunctiva/sclera: Conjunctivae normal.  Cardiovascular:     Rate and Rhythm: Normal rate and regular rhythm.     Heart  sounds: Normal heart sounds.  Pulmonary:     Effort: Pulmonary effort is normal.     Breath sounds: Normal breath sounds.  Abdominal:     General: Abdomen is flat. Bowel sounds are normal. There is no distension.     Palpations: Abdomen is soft.     Tenderness: There is no abdominal tenderness.  Musculoskeletal:        General: Normal range of motion.     Cervical back: Normal range of motion.  Skin:    General: Skin is warm.     Comments: Subcentimeter punctate lesion appreciated around navel and left shoulder.  Scabs overlying with surrounding erythema and induration.  No fluctuance.  No purulence expressed.  Moderate pain to palpation of areas.  Neurological:     General: No focal deficit present.     Mental Status: He is alert and oriented to person, place, and time.  Psychiatric:         Mood and Affect: Mood normal.        Behavior: Behavior normal.         ED Results / Procedures / Treatments   Labs (all labs ordered are listed, but only abnormal results are displayed) Labs Reviewed - No data to display  EKG None  Radiology No results found.  Procedures Procedures    Medications Ordered in ED Medications - No data to display  ED Course/ Medical Decision Making/ A&P                                 Medical Decision Making Risk Prescription drug management.   23 year old with history of asthma, allergies, and anaphylaxis to food who presents with concern for abscess. Vitals reassuring. Exam notable for indurated and erythematous subcentimeter lesions on the navel and left shoulder. Differential includes insect bite, superimposed infection/abscess, trauma, drug reaction.  Patient overall well-appearing and is afebrile.  Areas do appear indurated and erythematous; however, no purulence on my exam.  Feel he can be treated as outpatient with doxycycline.  Advised close follow-up with PCP given his history of abscess formation requiring drainage.  Patient agreeable to plan.        Final Clinical Impression(s) / ED Diagnoses Final diagnoses:  Skin lesion    Rx / DC Orders ED Discharge Orders          Ordered    doxycycline (VIBRAMYCIN) 100 MG capsule  2 times daily        03/24/23 0753              Evette Georges, MD 03/24/23 7829    Gerhard Munch, MD 03/24/23 [phone]

## 2023-05-07 ENCOUNTER — Emergency Department (HOSPITAL_COMMUNITY)

## 2023-05-07 ENCOUNTER — Other Ambulatory Visit: Payer: Self-pay

## 2023-05-07 ENCOUNTER — Inpatient Hospital Stay (HOSPITAL_COMMUNITY)
Admission: EM | Admit: 2023-05-07 | Discharge: 2023-05-08 | DRG: 199 | Disposition: A | Discharge status: Home / Self Care | Attending: Internal Medicine | Admitting: Internal Medicine

## 2023-05-07 ENCOUNTER — Encounter (HOSPITAL_COMMUNITY): Payer: Self-pay

## 2023-05-07 DIAGNOSIS — J45901 Unspecified asthma with (acute) exacerbation: Secondary | ICD-10-CM | POA: Diagnosis present

## 2023-05-07 DIAGNOSIS — B348 Other viral infections of unspecified site: Secondary | ICD-10-CM | POA: Diagnosis not present

## 2023-05-07 DIAGNOSIS — J4521 Mild intermittent asthma with (acute) exacerbation: Secondary | ICD-10-CM | POA: Diagnosis present

## 2023-05-07 DIAGNOSIS — R0789 Other chest pain: Secondary | ICD-10-CM | POA: Diagnosis present

## 2023-05-07 DIAGNOSIS — Z8709 Personal history of other diseases of the respiratory system: Secondary | ICD-10-CM | POA: Diagnosis not present

## 2023-05-07 DIAGNOSIS — J9311 Primary spontaneous pneumothorax: Principal | ICD-10-CM | POA: Diagnosis present

## 2023-05-07 DIAGNOSIS — Z833 Family history of diabetes mellitus: Secondary | ICD-10-CM

## 2023-05-07 DIAGNOSIS — J939 Pneumothorax, unspecified: Secondary | ICD-10-CM | POA: Diagnosis present

## 2023-05-07 DIAGNOSIS — B9710 Unspecified enterovirus as the cause of diseases classified elsewhere: Secondary | ICD-10-CM | POA: Diagnosis present

## 2023-05-07 DIAGNOSIS — Z79899 Other long term (current) drug therapy: Secondary | ICD-10-CM

## 2023-05-07 DIAGNOSIS — J3089 Other allergic rhinitis: Secondary | ICD-10-CM | POA: Diagnosis present

## 2023-05-07 DIAGNOSIS — Z8249 Family history of ischemic heart disease and other diseases of the circulatory system: Secondary | ICD-10-CM | POA: Diagnosis not present

## 2023-05-07 DIAGNOSIS — J9601 Acute respiratory failure with hypoxia: Secondary | ICD-10-CM | POA: Diagnosis present

## 2023-05-07 DIAGNOSIS — J4540 Moderate persistent asthma, uncomplicated: Secondary | ICD-10-CM | POA: Diagnosis present

## 2023-05-07 DIAGNOSIS — R0902 Hypoxemia: Secondary | ICD-10-CM

## 2023-05-07 DIAGNOSIS — J189 Pneumonia, unspecified organism: Secondary | ICD-10-CM | POA: Diagnosis present

## 2023-05-07 DIAGNOSIS — Z91010 Allergy to peanuts: Secondary | ICD-10-CM | POA: Diagnosis not present

## 2023-05-07 DIAGNOSIS — B9789 Other viral agents as the cause of diseases classified elsewhere: Secondary | ICD-10-CM | POA: Diagnosis present

## 2023-05-07 DIAGNOSIS — J982 Interstitial emphysema: Secondary | ICD-10-CM | POA: Diagnosis present

## 2023-05-07 DIAGNOSIS — Z825 Family history of asthma and other chronic lower respiratory diseases: Secondary | ICD-10-CM

## 2023-05-07 LAB — RESPIRATORY PANEL BY PCR

## 2023-05-07 LAB — BASIC METABOLIC PANEL WITH GFR
Anion gap: 10 (ref 5–15)
BUN: 9 mg/dL (ref 6–20)
CO2: 26 mmol/L (ref 22–32)
Calcium: 8.9 mg/dL (ref 8.9–10.3)
Chloride: 103 mmol/L (ref 98–111)
Creatinine, Ser: 1.2 mg/dL (ref 0.61–1.24)
GFR, Estimated: >60 mL/min (ref >60)
Glucose, Bld: 89 mg/dL (ref 70–99)
Potassium: 3.6 mmol/L (ref 3.5–5.1)
Sodium: 139 mmol/L (ref 135–145)

## 2023-05-07 LAB — CBC
HCT: 46.9 % (ref 39.0–52.0)
Hemoglobin: 15.2 g/dL (ref 13.0–17.0)
MCH: 29.2 pg (ref 26.0–34.0)
MCHC: 32.4 g/dL (ref 30.0–36.0)
MCV: 90 fL (ref 80.0–100.0)
Platelets: 238 10*3/uL (ref 150–400)
RBC: 5.21 MIL/uL (ref 4.22–5.81)
RDW: 12.4 % (ref 11.5–15.5)
WBC: 5.7 10*3/uL (ref 4.0–10.5)
nRBC: 0 % (ref 0.0–0.2)

## 2023-05-07 LAB — TROPONIN I (HIGH SENSITIVITY): Troponin I (High Sensitivity): 3 ng/L (ref <18)

## 2023-05-07 MED ORDER — ONDANSETRON HCL 4 MG/2ML IJ SOLN: 4.0000 mg | Freq: Four times a day (QID) | INTRAMUSCULAR | Status: DC | PRN

## 2023-05-07 MED ORDER — HYDROCOD POLI-CHLORPHE POLI ER 10-8 MG/5ML PO SUER: 2.5000 mL | Freq: Two times a day (BID) | ORAL | 0 refills | Status: DC

## 2023-05-07 MED ORDER — SODIUM CHLORIDE 0.9 % IV SOLN: 1.0000 g | INTRAVENOUS | Status: DC

## 2023-05-07 MED ORDER — ARFORMOTEROL TARTRATE 15 MCG/2ML IN NEBU
15.0000 ug | INHALATION_SOLUTION | Freq: Two times a day (BID) | RESPIRATORY_TRACT | Status: DC
  Administered 2023-05-07 – 2023-05-08 (×3): 15 ug via RESPIRATORY_TRACT
  Filled 2023-05-07 (×3): qty 2

## 2023-05-07 MED ORDER — MORPHINE SULFATE (PF) 4 MG/ML IV SOLN
4.0000 mg | Freq: Once | INTRAVENOUS | Status: AC
  Administered 2023-05-07: 4 mg via INTRAVENOUS
  Filled 2023-05-07: qty 1

## 2023-05-07 MED ORDER — PREDNISONE 20 MG PO TABS
60.0000 mg | ORAL_TABLET | Freq: Once | ORAL | Status: AC
  Administered 2023-05-07: 60 mg via ORAL
  Filled 2023-05-07: qty 3

## 2023-05-07 MED ORDER — BUDESONIDE 0.5 MG/2ML IN SUSP
0.5000 mg | Freq: Two times a day (BID) | RESPIRATORY_TRACT | Status: DC
  Administered 2023-05-07 – 2023-05-08 (×3): 0.5 mg via RESPIRATORY_TRACT
  Filled 2023-05-07 (×3): qty 2

## 2023-05-07 MED ORDER — MONTELUKAST SODIUM 10 MG PO TABS
10.0000 mg | ORAL_TABLET | Freq: Every day | ORAL | Status: DC
  Administered 2023-05-07 – 2023-05-08 (×2): 10 mg via ORAL
  Filled 2023-05-07 (×2): qty 1

## 2023-05-07 MED ORDER — ACETAMINOPHEN 650 MG RE SUPP: 650.0000 mg | Freq: Four times a day (QID) | RECTAL | Status: DC | PRN

## 2023-05-07 MED ORDER — GUAIFENESIN-DM 100-10 MG/5ML PO SYRP
5.0000 mL | ORAL_SOLUTION | ORAL | Status: DC | PRN
  Administered 2023-05-08: 5 mL via ORAL
  Filled 2023-05-07: qty 10

## 2023-05-07 MED ORDER — AZITHROMYCIN 250 MG PO TABS: 250.0000 mg | ORAL_TABLET | Freq: Every day | ORAL | 0 refills | Status: DC

## 2023-05-07 MED ORDER — ONDANSETRON HCL 4 MG PO TABS: 4.0000 mg | ORAL_TABLET | Freq: Four times a day (QID) | ORAL | Status: DC | PRN

## 2023-05-07 MED ORDER — REVEFENACIN 175 MCG/3ML IN SOLN
175.0000 ug | Freq: Every day | RESPIRATORY_TRACT | Status: DC
  Administered 2023-05-08: 175 ug via RESPIRATORY_TRACT
  Filled 2023-05-07 (×2): qty 3

## 2023-05-07 MED ORDER — SODIUM CHLORIDE 0.9 % IV SOLN
500.0000 mg | Freq: Once | INTRAVENOUS | Status: AC
  Administered 2023-05-07: 500 mg via INTRAVENOUS
  Filled 2023-05-07: qty 5

## 2023-05-07 MED ORDER — IPRATROPIUM-ALBUTEROL 0.5-2.5 (3) MG/3ML IN SOLN
3.0000 mL | Freq: Once | RESPIRATORY_TRACT | Status: AC
  Administered 2023-05-07: 3 mL via RESPIRATORY_TRACT
  Filled 2023-05-07: qty 3

## 2023-05-07 MED ORDER — SODIUM CHLORIDE 0.9 % IV SOLN: 500.0000 mg | INTRAVENOUS | Status: DC

## 2023-05-07 MED ORDER — ONDANSETRON HCL 4 MG/2ML IJ SOLN
4.0000 mg | Freq: Once | INTRAMUSCULAR | Status: AC
  Administered 2023-05-07: 4 mg via INTRAVENOUS
  Filled 2023-05-07: qty 2

## 2023-05-07 MED ORDER — PREDNISONE 20 MG PO TABS
40.0000 mg | ORAL_TABLET | Freq: Once | ORAL | Status: AC
  Administered 2023-05-07: 40 mg via ORAL
  Filled 2023-05-07: qty 2

## 2023-05-07 MED ORDER — ALBUTEROL SULFATE HFA 108 (90 BASE) MCG/ACT IN AERS: 1.0000 | INHALATION_SPRAY | Freq: Four times a day (QID) | RESPIRATORY_TRACT | 0 refills | Status: AC | PRN

## 2023-05-07 MED ORDER — FLUTICASONE PROPIONATE 50 MCG/ACT NA SUSP
1.0000 | Freq: Every day | NASAL | Status: DC
  Administered 2023-05-08: 1 via NASAL
  Filled 2023-05-07: qty 16

## 2023-05-07 MED ORDER — MELATONIN 5 MG PO TABS
5.0000 mg | ORAL_TABLET | Freq: Once | ORAL | Status: AC
  Administered 2023-05-07: 5 mg via ORAL
  Filled 2023-05-07: qty 1

## 2023-05-07 MED ORDER — OXYCODONE HCL 5 MG PO TABS
5.0000 mg | ORAL_TABLET | Freq: Four times a day (QID) | ORAL | Status: DC | PRN
  Administered 2023-05-07: 5 mg via ORAL
  Filled 2023-05-07: qty 1

## 2023-05-07 MED ORDER — SODIUM CHLORIDE 0.9 % IV SOLN
1.0000 g | Freq: Once | INTRAVENOUS | Status: AC
  Administered 2023-05-07: 1 g via INTRAVENOUS
  Filled 2023-05-07: qty 10

## 2023-05-07 MED ORDER — FAMOTIDINE 20 MG PO TABS
20.0000 mg | ORAL_TABLET | Freq: Two times a day (BID) | ORAL | Status: DC
  Administered 2023-05-07 – 2023-05-08 (×3): 20 mg via ORAL
  Filled 2023-05-07 (×3): qty 1

## 2023-05-07 MED ORDER — POTASSIUM CHLORIDE CRYS ER 20 MEQ PO TBCR
40.0000 meq | EXTENDED_RELEASE_TABLET | Freq: Once | ORAL | Status: AC
  Administered 2023-05-07: 40 meq via ORAL
  Filled 2023-05-07: qty 2

## 2023-05-07 MED ORDER — ALBUTEROL SULFATE (2.5 MG/3ML) 0.083% IN NEBU: 2.5000 mg | INHALATION_SOLUTION | RESPIRATORY_TRACT | Status: DC | PRN

## 2023-05-07 MED ORDER — IOHEXOL 300 MG/ML  SOLN
100.0000 mL | Freq: Once | INTRAMUSCULAR | Status: AC | PRN
  Administered 2023-05-07: 100 mL via INTRAVENOUS

## 2023-05-07 MED ORDER — ENOXAPARIN SODIUM 40 MG/0.4ML IJ SOSY
40.0000 mg | PREFILLED_SYRINGE | INTRAMUSCULAR | Status: DC
  Administered 2023-05-07: 40 mg via SUBCUTANEOUS
  Filled 2023-05-07: qty 0.4

## 2023-05-07 MED ORDER — KETOROLAC TROMETHAMINE 30 MG/ML IJ SOLN: 30.0000 mg | Freq: Four times a day (QID) | INTRAMUSCULAR | Status: AC | PRN

## 2023-05-07 MED ORDER — LORATADINE 10 MG PO TABS
10.0000 mg | ORAL_TABLET | Freq: Every day | ORAL | Status: DC
  Administered 2023-05-07 – 2023-05-08 (×2): 10 mg via ORAL
  Filled 2023-05-07 (×2): qty 1

## 2023-05-07 MED ORDER — PREDNISONE 20 MG PO TABS
40.0000 mg | ORAL_TABLET | Freq: Every day | ORAL | Status: DC
  Administered 2023-05-08: 40 mg via ORAL
  Filled 2023-05-07: qty 2

## 2023-05-07 MED ORDER — ACETAMINOPHEN 325 MG PO TABS: 650.0000 mg | ORAL_TABLET | Freq: Four times a day (QID) | ORAL | Status: DC | PRN

## 2023-05-07 NOTE — ED Notes (Signed)
 Ambulated patient around the hall with pulse oximeter. Patient maintain O2 95-96% with lowest being 93%. We did have to stop a couple of times to catch his breath.

## 2023-05-07 NOTE — ED Notes (Signed)
 Patient transported to CT

## 2023-05-07 NOTE — H&P (Signed)
 History and Physical    Patient: Edward Cervantes:[id number] DOB: 2000-07-26 DOA: 05/07/2023 DOS: the patient was seen and examined on 05/07/2023 PCP: Danella Dunn, MD  Patient coming from: Home  Chief Complaint:  Chief Complaint  Patient presents with   Chest Pain   HPI: Edward Cervantes is a 23 y.o. 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  He stated he spent 4 days in the woods and thinks that this may have exacerbated his allergies.  He got back home and his symptoms get getting worse.  Yesterday evening he was driving and felt like tightness on on his chest radiating to his neck after having a severe coughing spell. He denied fever, chills, rhinorrhea, sore throat, wheezing or hemoptysis.  No chest pain, palpitations, diaphoresis, PND, orthopnea or pitting edema of the lower extremities.  No abdominal pain, nausea, emesis, diarrhea, constipation, melena or hematochezia.  No flank pain, dysuria, frequency or hematuria.  No polyuria, polydipsia, polyphagia or blurred vision.   Lab work: Respiratory pathogen by PCR was positive for rhinovirus/enterovirus.  CBC, BMP and troponin level were normal.  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  CT of the neck may be helpful for further evaluation.  CT chest with contrast showed trace right pneumothorax.  No evidence of tension physiology.  Pneumomediastinum extending into the neck.  No mediastinal fluid or abscess.  Bronchitis with developing pneumonia in the right lower lobe.  Mucosal thickening in the visualized ethmoid air cells and maxillary sinuses.  Correlate for acute sinusitis.   Follow-up 2 view chest radiograph not showing pneumothorax seen on CT scan earlier, but shows soft tissue gas in the low neck and supraclavicular region.   ED course: Initial vital signs were temperature 97.7 F, pulse 85, respiration 18, BP 136/104 mmHg O2 sat 99% on room air.  Patient received azithromycin 500 mg IVPB, ceftriaxone 1 g IVPB, a DuoNeb, morphine 4 mg IVP, ondansetron 4 mg IVP and prednisone  40 mg p.o. x 1.  Review of Systems: As mentioned in the history of present illness. All other systems reviewed and are negative.  Past Medical History:  Diagnosis Date   Asthma    Food allergy, peanut    Seasonal allergies    Past Surgical History:  Procedure Laterality Date   TONSILLECTOMY     TYMPANOSTOMY TUBE PLACEMENT     Social History:  reports that he has never smoked. He has never used smokeless tobacco. He reports that he does not drink alcohol and does not use drugs.  Allergies  Allergen Reactions   Peanut-Containing Drug Products Anaphylaxis    Family History  Problem Relation Age of Onset   Hypertension Other    Diabetes Other    Asthma Other    Healthy Father     Prior to Admission medications   Medication Sig Start Date End Date Taking? Authorizing Provider  albuterol  (VENTOLIN  HFA) 108 (90 Base) MCG/ACT inhaler Inhale 1-2 puffs into the lungs every 6 (six) hours as needed for wheezing or shortness of breath. 05/07/23  Yes Darlis Eisenmenger, PA-C  azithromycin (ZITHROMAX Z-PAK) 250 MG tablet Take 1 tablet (250 mg total) by mouth daily for 4 days. 05/07/23 05/11/23 Yes Abigail Abler,  Sherian Dimitri, PA-C  cetirizine  (ZYRTEC ) 10 MG tablet TAKE 1 TABLET BY MOUTH EVERY DAY 12/04/18  Yes Cervantes, Edward J, MD  chlorpheniramine-HYDROcodone (TUSSIONEX) 10-8 MG/5ML Take 2.5-5 mLs by mouth 2 (two) times daily. 05/07/23  Yes Darlis Eisenmenger, PA-C  fluticasone  (FLONASE ) 50 MCG/ACT nasal spray Place 1 spray into both nostrils daily. 02/01/22  Yes Lamptey, Donley Furth, MD  EPINEPHrine  0.3 mg/0.3 mL IJ  SOAJ injection Use as directed for life-threatening allergic reaction. Patient not taking: Reported on 05/07/2023 11/08/17   Cervantes, Edward J, MD  FLOVENT  HFA 44 MCG/ACT inhaler INHALE 3 PUFFS 3 TIMES PER DAY DURING ASTHMA FLARE. RINSE, GARGLE, SPIT AFTER USE. Patient not taking: Reported on 05/07/2023 04/23/19   Cervantes, Edward J, MD  FOCALIN XR 20 MG 24 hr capsule Take 20 mg by mouth every morning. Patient not taking: Reported on 05/07/2023 01/20/15   [provider]  ibuprofen  (ADVIL ) 600 MG tablet Take 1 tablet (600 mg total) by mouth every 6 (six) hours as needed. Patient not taking: Reported on 05/07/2023 02/01/22   Corine Dice, MD  Multiple Vitamins-Minerals (DAILY MULTIVITAMIN PO) Take by mouth daily. Patient not taking: Reported on 05/07/2023    [provider]    Physical Exam: Vitals:   05/07/23 0307 05/07/23 0400 05/07/23 0659 05/07/23 0730  BP: 125/77 (!) 114/58 117/75 110/67  Pulse: 91 72 63 (!) 59  Resp: 20 14 18 12   Temp:   97.6 F (36.4 C)   TempSrc:   Oral   SpO2: 90% 97% 90% 91%  Weight:      Height:       Physical Exam Vitals and nursing note reviewed.  Constitutional:      General: He is awake. He is not in acute distress.    Appearance: He is well-developed. He is ill-appearing.     Interventions: Nasal cannula in place.  HENT:     Head: Normocephalic.     Nose: No rhinorrhea.     Mouth/Throat:     Mouth: Mucous membranes are moist.  Eyes:     General: No scleral icterus.    Pupils: Pupils are equal, round, and reactive to light.  Neck:     Vascular: No JVD.  Cardiovascular:     Rate and Rhythm: Normal rate and regular rhythm.     Heart sounds: S1 normal and S2 normal.  Pulmonary:     Effort: No tachypnea.     Breath sounds: Wheezing present. No decreased breath sounds, rhonchi or rales.  Abdominal:     General: Bowel sounds are normal. There is no distension.     Palpations: Abdomen is soft.     Tenderness: There is no abdominal  tenderness. There is no guarding.  Musculoskeletal:     Cervical back: Neck supple.     Right lower leg: No edema.     Left lower leg: No edema.  Skin:    General: Skin is warm and dry.  Neurological:     General: No focal deficit present.     Mental Status: He is alert and oriented to person, place, and time.  Psychiatric:        Mood and Affect: Mood normal.        Behavior: Behavior normal. Behavior is cooperative.     Data Reviewed:  Results are pending, will review when available.  EKG: Vent. rate 79 BPM PR interval 152 ms QRS duration 98 ms QT/QTcB 354/406 ms P-R-T axes 68 80 69  Sinus rhythm ST elevation, consider early repolarization No old tracing to compare  Assessment and Plan: Principal Problem:   Acute respiratory failure with hypoxia (HCC) Due to:   Asthma with acute exacerbation Associated with:   Allergic rhinitis Complicated by:   Chest tightness Due to:   Pneumothorax   Pneumomediastinum (HCC) Progressive/inpatient. Continue supplemental oxygen. Continue prednisone  40 mg p.o. daily in a.m. Scheduled and as needed bronchodilators. Continue cetirizine  or formulary equivalent daily. Begin montelukast 10 mg p.o. daily. Follow-up CBC and chemistry in the morning.  Pulmonology consult appreciated. -Will follow their recommendations.   Advance Care Planning:   Code Status: Full Code   Consults:   Family Communication:   Severity of Illness: The appropriate patient status for this patient is INPATIENT. Inpatient status is judged to be reasonable and necessary in order to provide the required intensity of service to ensure the patient's safety. The patient's presenting symptoms, physical exam findings, and initial radiographic and laboratory data in the context of their chronic comorbidities is felt to place them at high risk for further clinical deterioration. Furthermore, it is not anticipated that the patient will be medically stable for discharge  from the hospital within 2 midnights of admission.   * I certify that at the point of admission it is my clinical judgment that the patient will require inpatient hospital care spanning beyond 2 midnights from the point of admission due to high intensity of service, high risk for further deterioration and high frequency of surveillance required.*  Author: Danice Dural, MD 05/07/2023 8:00 AM  For on call review [email or web address].   This document was prepared using Dragon voice recognition software and may contain some unintended transcription errors.

## 2023-05-07 NOTE — ED Triage Notes (Signed)
 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 Pt reports congestion, mucus in his chest, non productive cough. Pt reports drinking tea and taking allergy medicine with no relief.

## 2023-05-07 NOTE — ED Provider Notes (Signed)
 [place] EMERGENCY DEPARTMENT AT [facility] Provider Note   CSN: [id number] Arrival date & time: 05/07/23  4098     History  Chief Complaint  Patient presents with   Chest Pain    JOHNE TRAVERSE is a 23 y.o. male.  23 year old male with history of asthma as a child presents with complaint of severe allergies onset 4 days ago with cough, congestion, runny nose, sneezing, itchy eyes and sore throat. Developed chest tightness, wheezing, feeling like he has mucous in his chest. 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 No fevers. Has taking Zyrtec  and Flonase  for his symptoms. Chest felt more tight tonight which prompted ER visit.  Does vape.        Home Medications Prior to Admission medications   Medication Sig Start Date End Date Taking? Authorizing Provider  albuterol  (VENTOLIN  HFA) 108 (90 Base) MCG/ACT inhaler Inhale 1-2 puffs into the lungs every 6 (six) hours as needed for wheezing or shortness of breath. 05/07/23  Yes Darlis Eisenmenger, PA-C  azithromycin (ZITHROMAX Z-PAK) 250 MG tablet Take 1 tablet (250 mg total) by mouth daily for 4 days. 05/07/23 05/11/23 Yes Darlis Eisenmenger, PA-C  cetirizine  (ZYRTEC ) 10 MG tablet TAKE 1 TABLET BY MOUTH EVERY DAY 12/04/18  Yes Kozlow, Eric J, MD  chlorpheniramine-HYDROcodone (TUSSIONEX) 10-8 MG/5ML Take 2.5-5 mLs by mouth 2 (two) times daily. 05/07/23  Yes Darlis Eisenmenger, PA-C  fluticasone  (FLONASE ) 50 MCG/ACT nasal spray Place 1 spray into both nostrils daily. 02/01/22  Yes Lamptey, Donley Furth, MD  EPINEPHrine  0.3 mg/0.3 mL IJ SOAJ injection Use as directed for life-threatening allergic reaction. Patient not taking: Reported on 05/07/2023 11/08/17   Kozlow, Eric J, MD  FLOVENT  HFA 44 MCG/ACT inhaler INHALE 3 PUFFS 3 TIMES PER DAY DURING ASTHMA FLARE. RINSE, GARGLE, SPIT AFTER USE. Patient not taking: Reported on 05/07/2023 04/23/19   Kozlow,  Eric J, MD  FOCALIN XR 20 MG 24 hr capsule Take 20 mg by mouth every morning. Patient not taking: Reported on 05/07/2023 01/20/15   [provider]  ibuprofen  (ADVIL ) 600 MG tablet Take 1 tablet (600 mg total) by mouth every 6 (six) hours as needed. Patient not taking: Reported on 05/07/2023 02/01/22   Corine Dice, MD  Multiple Vitamins-Minerals (DAILY MULTIVITAMIN PO) Take by mouth daily. Patient not taking: Reported on 05/07/2023    [provider]      Allergies    Peanut-containing drug products    Review of Systems   Review of Systems Negative except as per HPI Physical Exam Updated Vital Signs BP 110/67   Pulse (!) 59   Temp 97.6 F (36.4 C) (Oral)   Resp 12   Ht 6\' 2"  (1.88 m)   Wt 74.8 kg   SpO2 91%   BMI 21.18 kg/m  Physical Exam Vitals and nursing note reviewed.  Constitutional:      General: He is not in acute distress.    Appearance: He is well-developed. He is not diaphoretic.  HENT:     Head: Normocephalic and atraumatic.  Cardiovascular:     Rate and Rhythm: Normal rate and regular rhythm.     Heart sounds: Normal heart sounds.  Pulmonary:     Effort: Pulmonary effort is normal.     Breath sounds: Examination of the right-lower field reveals decreased breath sounds and wheezing. Examination  of the left-lower field reveals decreased breath sounds and wheezing. Decreased breath sounds and wheezing present.  Chest:    Musculoskeletal:     Cervical back: Neck supple.     Right lower leg: No tenderness. No edema.     Left lower leg: No tenderness. No edema.  Skin:    General: Skin is warm and dry.  Neurological:     Mental Status: He is alert and oriented to person, place, and time.  Psychiatric:        Behavior: Behavior normal.     ED Results / Procedures / Treatments   Labs (all labs ordered are listed, but only abnormal results are displayed) Labs Reviewed  BASIC METABOLIC PANEL WITH GFR  CBC  TROPONIN I (HIGH SENSITIVITY)     EKG EKG Interpretation Date/Time:  Saturday May 07 2023 01:16:45 EDT Ventricular Rate:  79 PR Interval:  152 QRS Duration:  98 QT Interval:  354 QTC Calculation: 406 R Axis:   80  Text Interpretation: Sinus rhythm ST elevation, consider early repolarization No old tracing to compare Confirmed by Townsend Freud [phone]) on 05/07/2023 1:30:50 AM  Radiology DG Chest 2 View Result Date: 05/07/2023 CLINICAL DATA:  Shortness of breath and chest pain. EXAM: CHEST - 2 VIEW COMPARISON:  05/07/2023 FINDINGS: The cardiopericardial silhouette is within normal limits for size. Soft tissue gas identified in the low neck and supraclavicular region bilaterally with evidence of upper pneumomediastinum. The tiny right apical pneumothorax seen on CT scan earlier today is not readily evident by x-ray. Telemetry leads overlie the chest. IMPRESSION: 1. Soft tissue gas in the low neck and supraclavicular region bilaterally with evidence of upper pneumomediastinum. 2. The tiny right apical pneumothorax seen on CT scan earlier today is not readily evident by x-ray. Electronically Signed   By: Donnal Fusi M.D.   On: 05/07/2023 06:37   CT Chest W Contrast Result Date: 05/07/2023 CLINICAL DATA:  Subcutaneous air in neck. Chest tightness and pressure. Shortness of breath. EXAM: CT CHEST WITH CONTRAST TECHNIQUE: Multidetector CT imaging of the chest was performed during intravenous contrast administration. RADIATION DOSE REDUCTION: This exam was performed according to the departmental dose-optimization program which includes automated exposure control, adjustment of the mA and/or kV according to patient size and/or use of iterative reconstruction technique. CONTRAST:  100mL OMNIPAQUE IOHEXOL 300 MG/ML  SOLN COMPARISON:  Same day chest radiograph FINDINGS: Cardiovascular: Normal heart size. No pericardial effusion. Normal caliber thoracic aorta without dissection. Mediastinum/Nodes: Pneumomediastinum extending into the neck.  No mediastinal fluid or abscess. Trachea and esophagus are unremarkable. No pathologically enlarged lymph nodes. Residual thymic tissue in the anterior mediastinum. Lungs/Pleura: Trace right pneumothorax. No evidence of tension physiology. Diffuse bronchial wall thickening with mucous plugging in the lower lobes. Patchy ground-glass opacities in the right lower lobe. No pleural effusion. Upper Abdomen: No acute abnormality. Musculoskeletal: No acute fracture. Skull base and face: Mucosal thickening in the ethmoid air cells and bilateral maxillary sinuses. IMPRESSION: 1. Trace right pneumothorax. No evidence of tension physiology. 2. Pneumomediastinum extending into the neck. No mediastinal fluid or abscess. 3. Bronchitis with developing pneumonia in the right lower lobe. 4. Mucosal thickening in the visualized ethmoid air cells and maxillary sinuses. Correlate for acute sinusitis. Electronically Signed   By: Rozell Cornet M.D.   On: 05/07/2023 03:18   DG Chest 2 View Result Date: 05/07/2023 CLINICAL DATA:  Chest pain EXAM: CHEST - 2 VIEW COMPARISON:  11/28/2021 FINDINGS: Cardiac shadow is within normal limits. The lungs  are well aerated bilaterally. No focal infiltrate, effusion or pneumothorax is seen. Air is noted along the inferior aspect of the neck consistent with some subcutaneous emphysema. IMPRESSION: Subcutaneous emphysema in the neck. CT of the chest may be helpful for further evaluation. Electronically Signed   By: Violeta Grey M.D.   On: 05/07/2023 01:20    Procedures .Critical Care  Performed by: Darlis Eisenmenger, PA-C Authorized by: Darlis Eisenmenger, PA-C   Critical care provider statement:    Critical care time (minutes):  30   Critical care was time spent personally by me on the following activities:  Development of treatment plan with patient or surrogate, discussions with consultants, evaluation of patient's response to treatment, examination of patient, ordering and review of  laboratory studies, ordering and review of radiographic studies, ordering and performing treatments and interventions, pulse oximetry, re-evaluation of patient's condition and review of old charts     Medications Ordered in ED Medications  ipratropium-albuterol  (DUONEB) 0.5-2.5 (3) MG/3ML nebulizer solution 3 mL (3 mLs Nebulization Given 05/07/23 0117)  predniSONE  (DELTASONE ) tablet 60 mg (60 mg Oral Given 05/07/23 0116)  ondansetron (ZOFRAN) injection 4 mg (4 mg Intravenous Given 05/07/23 0240)  morphine (PF) 4 MG/ML injection 4 mg (4 mg Intravenous Given 05/07/23 0240)  iohexol (OMNIPAQUE) 300 MG/ML solution 100 mL (100 mLs Intravenous Contrast Given 05/07/23 0255)  azithromycin (ZITHROMAX) 500 mg in sodium chloride 0.9 % 250 mL IVPB (0 mg Intravenous Stopped 05/07/23 0527)  cefTRIAXone (ROCEPHIN) 1 g in sodium chloride 0.9 % 100 mL IVPB (0 g Intravenous Stopped 05/07/23 0418)    ED Course/ Medical Decision Making/ A&P                                 Medical Decision Making Amount and/or Complexity of Data Reviewed Labs: ordered. Radiology: ordered.  Risk Prescription drug management.   This patient presents to the ED for concern of chest tightness/pain/wheezing, this involves an extensive number of treatment options, and is a complaint that carries with it a high risk of complications and morbidity.  The differential diagnosis includes but not limited to asthma, PNX, PNA   Co morbidities that complicate the patient evaluation  Asthma, vapes   Additional history obtained:  Additional history obtained from mom at bedside External records from outside source obtained and reviewed including prior visit for skin lesion; no prior labs on file   Lab Tests:  I Ordered, and personally interpreted labs.  The pertinent results include:  CBC, BMP, trop WNL.    Imaging Studies ordered:  I ordered imaging studies including CXR, CT chest  I independently visualized and interpreted imaging  which showed CXR with concern for sub Q air.  I agree with the radiologist interpretation Repeat CXR without progression of small pneumothorax seen on CT   Cardiac Monitoring: / EKG:  The patient was maintained on a cardiac monitor.  I personally viewed and interpreted the cardiac monitored which showed an underlying rhythm of: sinus rhythm, rate 79   Problem List / ED Course / Critical interventions / Medication management  23 year old male presents with complaint of bad allergies and mucous in the chest onset Monday. States while driving around with friends today he had pain in his chest with tightness and wheezing which prompted him to come to the ER. Found to have diminished lung sounds in the bases with mild wheezing, provided with albuterol  neb without  improvement, CXR concerning for sub q air. CT chest obtained as above. Patient was given morphine for pain and was able to sleep. O2 sats dropped to 88% on RA while sleeping. Patient would wake with verbal stimuli and sats improve to 94% but would drop again with sleep. Patient was placed on 2 L [place] with improvement to 97%. On recheck, patient more awake, painful productive cough (clear sputum), O2 removed and sats 92-95% on RA. Plain is to ambulate with pulse ox and reassess. May need admission.  Patient ambulated around the department, O2 sat as low as 93% while ambulating. Upon return to room, O2 sat 90% and not recovering with rest. Placed back on 2L [place]. Hospitalist paged for consult for admission.  I ordered medication including albuterol  for cough, chest tightness, morphine/zofran  for pain. Zithromax and rocephin for PNA on CT. Reevaluation of the patient after these medicines showed that the patient improved I have reviewed the patients home medicines and have made adjustments as needed   Consultations Obtained:  I requested consultation with the ER attending, Dr. Morris Arch,  and discussed lab and imaging findings as well as pertinent  plan - they recommend: monitor, repeat CXR, if stable can likely dc D[facility]. Requests secure chat to pulm for consult while in the hospital. Message sent to Dr. Fulton Job. Oncoming ER team aware.    Social Determinants of Health:  Has PCP   Test / Admission - Considered:  admit         Final Clinical Impression(s) / ED Diagnoses Final diagnoses:  Pneumonia of right lower lobe due to infectious organism  Primary spontaneous pneumothorax  Pneumomediastinum (HCC)  Hypoxia    Rx / DC Orders ED Discharge Orders          Ordered    albuterol  (VENTOLIN  HFA) 108 (90 Base) MCG/ACT inhaler  Every 6 hours PRN        05/07/23 0519    azithromycin (ZITHROMAX Z-PAK) 250 MG tablet  Daily        05/07/23 0519    chlorpheniramine-HYDROcodone (TUSSIONEX) 10-8 MG/5ML  2 times daily        05/07/23 0658              Darlis Eisenmenger, PA-C 05/07/23 0756    Lindle Rhea, MD 05/08/23 [id number]

## 2023-05-07 NOTE — ED Notes (Signed)
 Patient back from CT.

## 2023-05-07 NOTE — ED Notes (Signed)
 Pt ambulated to the restroom and c/o ShOB, but no coughing or resp distress was noted.

## 2023-05-07 NOTE — Consult Note (Signed)
 NAME:  Edward Cervantes, MRN:  [id number], DOB:  15-Nov-2000, LOS: 0 ADMISSION DATE:  05/07/2023, CONSULTATION DATE: 5/3 REFERRING MD:  Mauricia South, CHIEF COMPLAINT:  pneumomediastinum   History of Present Illness:  Mr. Rodeman is a 23 year old gentleman with a history of allergies and asthma who presented with chest tightness, shortness of breath, nasal congestion, mucus and a nonproductive cough.  He has seasonal allergies and is feels his allergies have been worse recently since he had to sleep in a field.  He is in the [employer]  He has a history of childhood asthma requiring inhalers, but never required hospitalization. He has been off inhalers and has been fine for years.  His allergy medicines Flonase  and Zyrtec  have not provided relief.  In the emergency department he underwent CT scanning showing pneumomediastinum.  PCCM consulted for management.  Pertinent  Medical History  Childhood asthma Allergies Vaping  Significant Hospital Events: Including procedures, antibiotic start and stop dates in addition to other pertinent events   5/3 admitted  Interim History / Subjective:    Objective   Blood pressure 110/67, pulse (!) 59, temperature 97.6 F (36.4 C), temperature source Oral, resp. rate 12, height 6\' 2"  (1.88 m), weight 74.8 kg, SpO2 91%.        Intake/Output Summary (Last 24 hours) at 05/07/2023 0800 Last data filed at 05/07/2023 4098 Gross per 24 hour  Intake 382.18 ml  Output --  Net 382.18 ml   Filed Weights   05/07/23 0052  Weight: 74.8 kg    Examination: General: Lethargic appearing young man lying in bed no acute distress HENT: [place]/AT, eyes anicteric Lungs: Breathing comfortably on nasal cannula, faint end expiratory wheezing, no accessory muscle use or conversational dyspnea.  No observed coughing. Cardiovascular: S1-S2, regular rate and rhythm Abdomen: Soft, nontender Extremities: No peripheral edema Neuro: Lethargic but able to wake up and answer  questions Derm: No diffuse rashes  CT chest personally reviewed-airway thickening, pneumomediastinum with free air dissected down into the abdominal tissues and up into the superior neck.  Lucent peripheral area along the lateral right lung but no obvious pneumothorax that I can see.  No lobar consolidations.  Resolved Hospital Problem list     Assessment & Plan:  Pneumomediastinum triggered by acute asthma exacerbation and coughing episodes.  Baseline mild intermittent asthma. - Steroids - Bronchodilators-Yupelri, Pulmicort, and Brovana.  Will plan to discharge on ICS/LABA.  Long-term not sure that he will require inhalers year-round, but would treat for a few months after this acute episode. - Montelukast, Flonase , antihistamine; warned him to stop montelukast if he develops severe dreams - Steroids-prednisone  40 mg daily - Not likely to benefit from antibiotics without consolidations on CT scan.  Defer to primary - Pain control per primary - Robitussin as needed for cough -Stat chest x-ray if acute worsening in breathing as he is at risk for pneumothorax Strongly recommend he quit vaping and avoid all inhalational substances - Recommend outpatient follow-up with pulmonology to de-escalate inhalers over the next few months  PCCM will continue to follow.  Best Practice (right click and "Reselect all SmartList Selections" daily)   Per primary  Labs   CBC: Recent Labs  Lab 05/07/23 0150  WBC 5.7  HGB 15.2  HCT 46.9  MCV 90.0  PLT 238    Basic Metabolic Panel: Recent Labs  Lab 05/07/23 0150  NA 139  K 3.6  CL 103  CO2 26  GLUCOSE 89  BUN 9  CREATININE 1.20  CALCIUM 8.9   GFR: Estimated Creatinine Clearance: 101.3 mL/min (by C-G formula based on SCr of 1.2 mg/dL). Recent Labs  Lab 05/07/23 0150  WBC 5.7    Liver Function Tests: No results for input(s): "AST", "ALT", "ALKPHOS", "BILITOT", "PROT", "ALBUMIN" in the last 168 hours. No results for input(s):  "LIPASE", "AMYLASE" in the last 168 hours. No results for input(s): "AMMONIA" in the last 168 hours.  ABG No results found for: "PHART", "PCO2ART", "PO2ART", "HCO3", "TCO2", "ACIDBASEDEF", "O2SAT"   Coagulation Profile: No results for input(s): "INR", "PROTIME" in the last 168 hours.  Cardiac Enzymes: No results for input(s): "CKTOTAL", "CKMB", "CKMBINDEX", "TROPONINI" in the last 168 hours.  HbA1C: No results found for: "HGBA1C"  CBG: No results for input(s): "GLUCAP" in the last 168 hours.  Review of Systems:   Review of Systems  HENT:  Positive for congestion.   Respiratory:  Positive for cough, shortness of breath and wheezing.   Cardiovascular:  Positive for chest pain. Negative for leg swelling.     Past Medical History:  He,  has a past medical history of Asthma, Food allergy, peanut, and Seasonal allergies.   Surgical History:   Past Surgical History:  Procedure Laterality Date   TONSILLECTOMY     TYMPANOSTOMY TUBE PLACEMENT       Social History:   reports that he has never smoked. He has never used smokeless tobacco. He reports that he does not drink alcohol and does not use drugs.   Family History:  His family history includes Asthma in an other family member; Diabetes in an other family member; Healthy in his father; Hypertension in an other family member.   Allergies Allergies  Allergen Reactions   Peanut-Containing Drug Products Anaphylaxis     Home Medications  Prior to Admission medications   Medication Sig Start Date End Date Taking? Authorizing Provider  albuterol  (VENTOLIN  HFA) 108 (90 Base) MCG/ACT inhaler Inhale 1-2 puffs into the lungs every 6 (six) hours as needed for wheezing or shortness of breath. 05/07/23  Yes Darlis Eisenmenger, PA-C  azithromycin (ZITHROMAX Z-PAK) 250 MG tablet Take 1 tablet (250 mg total) by mouth daily for 4 days. 05/07/23 05/11/23 Yes Darlis Eisenmenger, PA-C  cetirizine  (ZYRTEC ) 10 MG tablet TAKE 1 TABLET BY MOUTH EVERY DAY  12/04/18  Yes Kozlow, Eric J, MD  chlorpheniramine-HYDROcodone (TUSSIONEX) 10-8 MG/5ML Take 2.5-5 mLs by mouth 2 (two) times daily. 05/07/23  Yes Darlis Eisenmenger, PA-C  fluticasone  (FLONASE ) 50 MCG/ACT nasal spray Place 1 spray into both nostrils daily. 02/01/22  Yes Lamptey, Donley Furth, MD  EPINEPHrine  0.3 mg/0.3 mL IJ SOAJ injection Use as directed for life-threatening allergic reaction. Patient not taking: Reported on 05/07/2023 11/08/17   Kozlow, Eric J, MD  FLOVENT  HFA 44 MCG/ACT inhaler INHALE 3 PUFFS 3 TIMES PER DAY DURING ASTHMA FLARE. RINSE, GARGLE, SPIT AFTER USE. Patient not taking: Reported on 05/07/2023 04/23/19   Kozlow, Eric J, MD  FOCALIN XR 20 MG 24 hr capsule Take 20 mg by mouth every morning. Patient not taking: Reported on 05/07/2023 01/20/15   [provider]  ibuprofen  (ADVIL ) 600 MG tablet Take 1 tablet (600 mg total) by mouth every 6 (six) hours as needed. Patient not taking: Reported on 05/07/2023 02/01/22   Corine Dice, MD  Multiple Vitamins-Minerals (DAILY MULTIVITAMIN PO) Take by mouth daily. Patient not taking: Reported on 05/07/2023    [provider]     Critical care time: n/a     Rice Chamorro  Evie Hoff, DO 05/07/23 9:33 AM [facility]  For contact information, see Amion. If no response to pager, please call PCCM consult pager. After hours, 7PM- 7AM, please call Elink.

## 2023-05-08 ENCOUNTER — Telehealth: Payer: Self-pay | Admitting: Critical Care Medicine

## 2023-05-08 ENCOUNTER — Encounter: Payer: Self-pay | Admitting: Internal Medicine

## 2023-05-08 ENCOUNTER — Other Ambulatory Visit: Payer: Self-pay | Admitting: Internal Medicine

## 2023-05-08 DIAGNOSIS — J9601 Acute respiratory failure with hypoxia: Secondary | ICD-10-CM | POA: Diagnosis not present

## 2023-05-08 LAB — COMPREHENSIVE METABOLIC PANEL WITH GFR
ALT: 19 U/L (ref 0–44)
AST: 18 U/L (ref 15–41)
Albumin: 3.4 g/dL — ABNORMAL LOW (ref 3.5–5.0)
Alkaline Phosphatase: 67 U/L (ref 38–126)
Anion gap: 8 (ref 5–15)
BUN: 11 mg/dL (ref 6–20)
CO2: 26 mmol/L (ref 22–32)
Calcium: 9.3 mg/dL (ref 8.9–10.3)
Chloride: 105 mmol/L (ref 98–111)
Creatinine, Ser: 0.83 mg/dL (ref 0.61–1.24)
GFR, Estimated: >60 mL/min (ref >60)
Glucose, Bld: 105 mg/dL — ABNORMAL HIGH (ref 70–99)
Potassium: 4 mmol/L (ref 3.5–5.1)
Sodium: 139 mmol/L (ref 135–145)
Total Bilirubin: 0.5 mg/dL (ref 0.0–1.2)
Total Protein: 6.4 g/dL — ABNORMAL LOW (ref 6.5–8.1)

## 2023-05-08 LAB — CBC
HCT: 42.3 % (ref 39.0–52.0)
Hemoglobin: 13.7 g/dL (ref 13.0–17.0)
MCH: 29.1 pg (ref 26.0–34.0)
MCHC: 32.4 g/dL (ref 30.0–36.0)
MCV: 90 fL (ref 80.0–100.0)
Platelets: 259 10*3/uL (ref 150–400)
RBC: 4.7 MIL/uL (ref 4.22–5.81)
RDW: 12.5 % (ref 11.5–15.5)
WBC: 9.5 10*3/uL (ref 4.0–10.5)
nRBC: 0 % (ref 0.0–0.2)

## 2023-05-08 LAB — EXPECTORATED SPUTUM ASSESSMENT W GRAM STAIN, RFLX TO RESP C

## 2023-05-08 LAB — STREP PNEUMONIAE URINARY ANTIGEN: Strep Pneumo Urinary Antigen: NEGATIVE

## 2023-05-08 LAB — HIV ANTIBODY (ROUTINE TESTING W REFLEX): HIV Screen 4th Generation wRfx: NONREACTIVE

## 2023-05-08 MED ORDER — BUDESONIDE-FORMOTEROL FUMARATE 80-4.5 MCG/ACT IN AERO: 2.0000 | INHALATION_SPRAY | Freq: Every morning | RESPIRATORY_TRACT | 0 refills | Status: DC

## 2023-05-08 MED ORDER — HYDROCOD POLI-CHLORPHE POLI ER 10-8 MG/5ML PO SUER: 2.5000 mL | Freq: Every evening | ORAL | 0 refills | Status: AC | PRN

## 2023-05-08 MED ORDER — MONTELUKAST SODIUM 10 MG PO TABS: 10.0000 mg | ORAL_TABLET | Freq: Every day | ORAL | 0 refills | Status: AC

## 2023-05-08 MED ORDER — AZITHROMYCIN 250 MG PO TABS: 250.0000 mg | ORAL_TABLET | Freq: Every day | ORAL | 0 refills | Status: AC

## 2023-05-08 MED ORDER — FAMOTIDINE 20 MG PO TABS: 20.0000 mg | ORAL_TABLET | Freq: Two times a day (BID) | ORAL | 0 refills | Status: AC

## 2023-05-08 MED ORDER — PREDNISONE 20 MG PO TABS: 40.0000 mg | ORAL_TABLET | Freq: Every day | ORAL | 0 refills | Status: AC

## 2023-05-08 NOTE — Discharge Summary (Signed)
 Physician Discharge Summary  BARRE ACORS ZOX:[id number] DOB: [birth year] DOA: 05/07/2023  PCP: Danella Dunn, MD  Admit date: 05/07/2023 Discharge date: 05/08/2023  Admitted From: home Discharge disposition: home   Recommendations for Outpatient Follow-Up:   Pulm: Con't bronchodilators- recommend discharge on ICS/LABA (low-dose), likely for about 3 months. Long-term not sure that he will require inhalers year-round, but would treat for a few months after this acute episode. -albuterol  PRN for breakthrough symptoms - Montelukast, Flonase , antihistamine - Steroids-prednisone  40 mg daily for 5 days total - robitussin PRN for cough - Recommend outpatient follow-up with pulmonology to de-escalate inhalers over the next few months> appt requested.   STOP VAPING  Discharge Diagnosis:   Principal Problem:   Acute respiratory failure with hypoxia (HCC) Active Problems:   Asthma with acute exacerbation   Allergic rhinitis   Pneumothorax   Pneumomediastinum (HCC)   Chest tightness    Discharge Condition: Improved.  Diet recommendation:   Regular.  Wound care: None.  Code status: Full.   History of Present Illness:   Edward Cervantes is a 23 y.o. 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  He stated he spent 4 days in the woods and thinks that this may have exacerbated his allergies.  He got back home and his symptoms get getting worse.  Yesterday evening he was driving and felt like tightness on on his chest radiating to his neck after having a severe coughing spell. He denied fever, chills, rhinorrhea, sore throat, wheezing or hemoptysis.  No chest  pain, palpitations, diaphoresis, PND, orthopnea or pitting edema of the lower extremities.  No abdominal pain, nausea, emesis, diarrhea, constipation, melena or hematochezia.  No flank pain, dysuria, frequency or hematuria.  No polyuria, polydipsia, polyphagia or blurred vision.   -see above re: recommendations from pulm -has been weaned off O2  -long discussion with patient and mom re: stopping vaping  Medical Consultants:    pulm  Discharge Exam:   Vitals:   05/08/23 0733 05/08/23 0735  BP:    Pulse:    Resp: 12   Temp:    SpO2: 98% 98%   Vitals:   05/08/23 0123 05/08/23 0422 05/08/23 0733 05/08/23 0735  BP: (!) 104/55 112/65    Pulse: 71 (!) 53    Resp:   12   Temp: 97.9 F (36.6 C) 97.7 F (36.5 C)    TempSrc: Oral Oral    SpO2: 99% 98% 98% 98%  Weight:      Height:        General exam: Appears calm and comfortable.   The results of significant diagnostics from this hospitalization (including imaging, microbiology, ancillary and laboratory) are listed below for reference.     Procedures and Diagnostic Studies:   DG Chest 2 View Result Date: 05/07/2023 CLINICAL DATA:  Shortness of breath and chest pain. EXAM: CHEST - 2 VIEW COMPARISON:  05/07/2023 FINDINGS: The cardiopericardial silhouette is within normal limits for size. Soft tissue gas identified in the low neck and supraclavicular region bilaterally with evidence of upper pneumomediastinum. The tiny right apical pneumothorax seen on CT scan earlier today is not readily evident by x-ray. Telemetry leads overlie the chest. IMPRESSION: 1. Soft tissue gas  in the low neck and supraclavicular region bilaterally with evidence of upper pneumomediastinum. 2. The tiny right apical pneumothorax seen on CT scan earlier today is not readily evident by x-ray. Electronically Signed   By: Donnal Fusi M.D.   On: 05/07/2023 06:37   CT Chest W Contrast Result Date: 05/07/2023 CLINICAL DATA:  Subcutaneous air in neck. Chest tightness  and pressure. Shortness of breath. EXAM: CT CHEST WITH CONTRAST TECHNIQUE: Multidetector CT imaging of the chest was performed during intravenous contrast administration. RADIATION DOSE REDUCTION: This exam was performed according to the departmental dose-optimization program which includes automated exposure control, adjustment of the mA and/or kV according to patient size and/or use of iterative reconstruction technique. CONTRAST:  100mL OMNIPAQUE IOHEXOL 300 MG/ML  SOLN COMPARISON:  Same day chest radiograph FINDINGS: Cardiovascular: Normal heart size. No pericardial effusion. Normal caliber thoracic aorta without dissection. Mediastinum/Nodes: Pneumomediastinum extending into the neck. No mediastinal fluid or abscess. Trachea and esophagus are unremarkable. No pathologically enlarged lymph nodes. Residual thymic tissue in the anterior mediastinum. Lungs/Pleura: Trace right pneumothorax. No evidence of tension physiology. Diffuse bronchial wall thickening with mucous plugging in the lower lobes. Patchy ground-glass opacities in the right lower lobe. No pleural effusion. Upper Abdomen: No acute abnormality. Musculoskeletal: No acute fracture. Skull base and face: Mucosal thickening in the ethmoid air cells and bilateral maxillary sinuses. IMPRESSION: 1. Trace right pneumothorax. No evidence of tension physiology. 2. Pneumomediastinum extending into the neck. No mediastinal fluid or abscess. 3. Bronchitis with developing pneumonia in the right lower lobe. 4. Mucosal thickening in the visualized ethmoid air cells and maxillary sinuses. Correlate for acute sinusitis. Electronically Signed   By: Rozell Cornet M.D.   On: 05/07/2023 03:18   DG Chest 2 View Result Date: 05/07/2023 CLINICAL DATA:  Chest pain EXAM: CHEST - 2 VIEW COMPARISON:  11/28/2021 FINDINGS: Cardiac shadow is within normal limits. The lungs are well aerated bilaterally. No focal infiltrate, effusion or pneumothorax is seen. Air is noted along the  inferior aspect of the neck consistent with some subcutaneous emphysema. IMPRESSION: Subcutaneous emphysema in the neck. CT of the chest may be helpful for further evaluation. Electronically Signed   By: Violeta Grey M.D.   On: 05/07/2023 01:20     Labs:   Basic Metabolic Panel: Recent Labs  Lab 05/07/23 0150 05/08/23 0530  NA 139 139  K 3.6 4.0  CL 103 105  CO2 26 26  GLUCOSE 89 105*  BUN 9 11  CREATININE 1.20 0.83  CALCIUM 8.9 9.3   GFR Estimated Creatinine Clearance: 146.4 mL/min (by C-G formula based on SCr of 0.83 mg/dL). Liver Function Tests: Recent Labs  Lab 05/08/23 0530  AST 18  ALT 19  ALKPHOS 67  BILITOT 0.5  PROT 6.4*  ALBUMIN 3.4*   No results for input(s): "LIPASE", "AMYLASE" in the last 168 hours. No results for input(s): "AMMONIA" in the last 168 hours. Coagulation profile No results for input(s): "INR", "PROTIME" in the last 168 hours.  CBC: Recent Labs  Lab 05/07/23 0150 05/08/23 0530  WBC 5.7 9.5  HGB 15.2 13.7  HCT 46.9 42.3  MCV 90.0 90.0  PLT 238 259   Cardiac Enzymes: No results for input(s): "CKTOTAL", "CKMB", "CKMBINDEX", "TROPONINI" in the last 168 hours. BNP: Invalid input(s): "POCBNP" CBG: No results for input(s): "GLUCAP" in the last 168 hours. D-Dimer No results for input(s): "DDIMER" in the last 72 hours. Hgb A1c No results for input(s): "HGBA1C" in the last 72 hours. Lipid Profile  No results for input(s): "CHOL", "HDL", "LDLCALC", "TRIG", "CHOLHDL", "LDLDIRECT" in the last 72 hours. Thyroid function studies No results for input(s): "TSH", "T4TOTAL", "T3FREE", "THYROIDAB" in the last 72 hours.  Invalid input(s): "FREET3" Anemia work up No results for input(s): "VITAMINB12", "FOLATE", "FERRITIN", "TIBC", "IRON", "RETICCTPCT" in the last 72 hours. Microbiology Recent Results (from the past 240 hours)  Respiratory (~20 pathogens) panel by PCR     Status: Abnormal   Collection Time: 05/07/23 10:45 AM   Specimen:  Nasopharyngeal Swab; Respiratory  Result Value Ref Range Status   Adenovirus NOT DETECTED NOT DETECTED Final   Coronavirus 229E NOT DETECTED NOT DETECTED Final    Comment: (NOTE) The Coronavirus on the Respiratory Panel, DOES NOT test for the novel  Coronavirus (2019 nCoV)    Coronavirus HKU1 NOT DETECTED NOT DETECTED Final   Coronavirus NL63 NOT DETECTED NOT DETECTED Final   Coronavirus OC43 NOT DETECTED NOT DETECTED Final   Metapneumovirus NOT DETECTED NOT DETECTED Final   Rhinovirus / Enterovirus DETECTED (A) NOT DETECTED Final   Influenza A NOT DETECTED NOT DETECTED Final   Influenza B NOT DETECTED NOT DETECTED Final   Parainfluenza Virus 1 NOT DETECTED NOT DETECTED Final   Parainfluenza Virus 2 NOT DETECTED NOT DETECTED Final   Parainfluenza Virus 3 NOT DETECTED NOT DETECTED Final   Parainfluenza Virus 4 NOT DETECTED NOT DETECTED Final   Respiratory Syncytial Virus NOT DETECTED NOT DETECTED Final   Bordetella pertussis NOT DETECTED NOT DETECTED Final   Bordetella Parapertussis NOT DETECTED NOT DETECTED Final   Chlamydophila pneumoniae NOT DETECTED NOT DETECTED Final   Mycoplasma pneumoniae NOT DETECTED NOT DETECTED Final    Comment: Performed at [facility], [address] [place], [place] [address]  Expectorated Sputum Assessment w Gram Stain, Rflx to Resp Cult     Status: None   Collection Time: 05/08/23  4:34 AM   Specimen: Sputum  Result Value Ref Range Status   Specimen Description SPUTUM  Final   Special Requests NONE  Final   Sputum evaluation   Final    Sputum specimen not acceptable for testing.  Please recollect.   Performed at [facility], [address], [place], [place] [address]    Report Status 05/08/2023 FINAL  Final  Expectorated Sputum Assessment w Gram Stain, Rflx to Resp Cult     Status: None   Collection Time: 05/08/23  6:30 AM  Result Value Ref Range Status   Specimen Description EXPECTORATED SPUTUM  Final   Special  Requests NONE  Final   Sputum evaluation   Final    THIS SPECIMEN IS ACCEPTABLE FOR SPUTUM CULTURE Performed at [facility], [address], [place], [place] [address]    Report Status 05/08/2023 FINAL  Final     Discharge Instructions:   Discharge Instructions     Diet - low sodium heart healthy   Complete by: As directed    Increase activity slowly   Complete by: As directed       Allergies as of 05/08/2023       Reactions   Peanut-containing Drug Products Anaphylaxis        Medication List     STOP taking these medications    Focalin XR 20 MG 24 hr capsule Generic drug: dexmethylphenidate       TAKE these medications    albuterol  108 (90 Base) MCG/ACT inhaler Commonly known as: VENTOLIN  HFA Inhale 1-2 puffs into the lungs every 6 (six)  hours as needed for wheezing or shortness of breath. What changed:  how much to take how to take this when to take this reasons to take this additional instructions   azithromycin 250 MG tablet Commonly known as: Zithromax Z-Pak Take 1 tablet (250 mg total) by mouth daily for 4 days.   budesonide-formoterol 80-4.5 MCG/ACT inhaler Commonly known as: Symbicort Inhale 2 puffs into the lungs in the morning.   cetirizine  10 MG tablet Commonly known as: ZYRTEC  TAKE 1 TABLET BY MOUTH EVERY DAY   chlorpheniramine-HYDROcodone 10-8 MG/5ML Commonly known as: TUSSIONEX Take 2.5-5 mLs by mouth 2 (two) times daily.   EPINEPHrine  0.3 mg/0.3 mL Soaj injection Commonly known as: EPI-PEN Use as directed for life-threatening allergic reaction.   famotidine 20 MG tablet Commonly known as: PEPCID Take 1 tablet (20 mg total) by mouth 2 (two) times daily.   fluticasone  50 MCG/ACT nasal spray Commonly known as: FLONASE  Place 1 spray into both nostrils daily.   ibuprofen  600 MG tablet Commonly known as: ADVIL  Take 1 tablet (600 mg total) by mouth every 6 (six) hours as needed.   montelukast 10 MG  tablet Commonly known as: SINGULAIR Take 1 tablet (10 mg total) by mouth daily at 6 (six) AM. Start taking on: May 09, 2023   predniSONE  20 MG tablet Commonly known as: DELTASONE  Take 2 tablets (40 mg total) by mouth daily with breakfast. Start taking on: May 09, 2023        Follow-up Information     Schedule an appointment as soon as possible for a visit  with Danella Dunn, MD.   Specialty: Family Medicine Contact information: [address] [place] [address] [phone]         [facility] Emergency Department at [facility].   Specialty: Emergency Medicine Why: If symptoms worsen Contact information: [address] [place]  [address] [phone]        Schedule an appointment as soon as possible for a visit  with Bartley Lightning, MD.   Specialty: Cardiothoracic Surgery Contact information: [address] [place] [address] [place] [address] [phone]                  Time coordinating discharge: 45 min  Signed:  Enrigue Harvard DO  [facility] 05/08/2023, 11:55 AM

## 2023-05-08 NOTE — Progress Notes (Signed)
 Mobility Specialist - Progress Note  Nurse requested Mobility Specialist to perform oxygen saturation test with pt which includes removing pt from oxygen both at rest and while ambulating.  Below are the results from that testing.     Patient Saturations on Room Air at Rest = spO2 99%  Patient Saturations on Room Air while Ambulating = sp02 95% .  At end of testing pt left in room on RA.  Reported results to nurse.   Pre-mobility: 104 bpm HR, During mobility: 122 bpm HR,  Post-mobility: 71 bpm HR,    05/08/23 1040  Mobility  Activity Ambulated independently in hallway  Level of Assistance Independent  Assistive Device None  Distance Ambulated (ft) 200 ft  Range of Motion/Exercises Active  Activity Response Tolerated well  Mobility Referral Yes  Mobility visit 1 Mobility  Mobility Specialist Start Time (ACUTE ONLY) 1030  Mobility Specialist Stop Time (ACUTE ONLY) 1040  Mobility Specialist Time Calculation (min) (ACUTE ONLY) 10 min   Pt was found in bed and agreeable to ambulate. No complaints and returned to bed with all needs met. Call bell in reach and mother in room.  Lorna Rose Mobility Specialist

## 2023-05-08 NOTE — Telephone Encounter (Signed)
 OP follow up requested with [facility].  Edward Mussel, DO 05/08/23 11:33 AM [facility]

## 2023-05-08 NOTE — TOC Initial Note (Signed)
 Transition of Care [facility]) - Initial/Assessment Note    Patient Details  Name: Edward Cervantes MRN: [id number] Date of Birth: 09-09-2000  Transition of Care [facility]) CM/SW Contact:    Levie Ream, RN Phone Number: 05/08/2023, 1:58 PM  Clinical Narrative:                 Home; no TOC needs.  Expected Discharge Plan: Home/Self Care Barriers to Discharge: No Barriers Identified   Patient Goals and CMS Choice Patient states their goals for this hospitalization and ongoing recovery are:: home          Expected Discharge Plan and Services   Discharge Planning Services: CM Consult Post Acute Care Choice: NA Living arrangements for the past 2 months: Single Family Home Expected Discharge Date: 05/08/23               DME Arranged: N/A DME Agency: NA       HH Arranged: NA HH Agency: NA        Prior Living Arrangements/Services Living arrangements for the past 2 months: Single Family Home Lives with:: Parents Patient language and need for interpreter reviewed:: Yes Do you feel safe going back to the place where you live?: Yes      Need for Family Participation in Patient Care: Yes (Comment) Care giver support system in place?: Yes (comment) Current home services:  (n/a) Criminal Activity/Legal Involvement Pertinent to Current Situation/Hospitalization: No - Comment as needed  Activities of Daily Living   ADL Screening (condition at time of admission) Independently performs ADLs?: Yes (appropriate for developmental age) Is the patient deaf or have difficulty hearing?: No Does the patient have difficulty seeing, even when wearing glasses/contacts?: No Does the patient have difficulty concentrating, remembering, or making decisions?: No  Permission Sought/Granted Permission sought to share information with : Case Manager Permission granted to share information with : Yes, Verbal Permission Granted  Share Information with NAME: Case Manager     Permission  granted to share info w Relationship: Kris Pester (mother) [phone]     Emotional Assessment Appearance:: Appears stated age Attitude/Demeanor/Rapport: Gracious Affect (typically observed): Accepting Orientation: : Oriented to Self, Oriented to Place, Oriented to  Time, Oriented to Situation Alcohol / Substance Use: Not Applicable Psych Involvement: No (comment)  Admission diagnosis:  Pneumomediastinum (HCC) [J98.2] Hypoxia [R09.02] Primary spontaneous pneumothorax [J93.11] Acute respiratory failure with hypoxia (HCC) [J96.01] Pneumonia of right lower lobe due to infectious organism [J18.9] Patient Active Problem List   Diagnosis Date Noted   Acute respiratory failure with hypoxia (HCC) 05/07/2023   Pneumothorax 05/07/2023   Pneumomediastinum (HCC) 05/07/2023   Chest tightness 05/07/2023   Allergic conjunctivitis 11/01/2016   Asthma with acute exacerbation 09/14/2014   Allergic rhinitis 09/14/2014   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   PCP:  Danella Dunn, MD Pharmacy:   [facility] [phone] - [place], [place] - [address] AT [facility] [address] [place] [address] Phone: [phone] Fax: [phone]     Social Drivers of Health (SDOH) Social History: SDOH Screenings   Food Insecurity: No Food Insecurity (05/08/2023)  Housing: Low Risk  (05/08/2023)  Transportation Needs: No Transportation Needs (05/08/2023)  Utilities: Not At Risk (05/08/2023)  Tobacco Use: Low Risk  (05/07/2023)   SDOH Interventions: Food Insecurity Interventions: Intervention Not Indicated, Inpatient TOC Housing Interventions: Intervention Not Indicated, Inpatient TOC Transportation Interventions: Intervention Not Indicated, Inpatient TOC Utilities Interventions: Intervention Not Indicated, Inpatient TOC   Readmission Risk  Interventions     No data to display

## 2023-05-08 NOTE — Progress Notes (Signed)
 NAME:  Edward Cervantes, MRN:  [id number], DOB:  08-Nov-2000, LOS: 1 ADMISSION DATE:  05/07/2023, CONSULTATION DATE: 5/3 REFERRING MD:  Mauricia South, CHIEF COMPLAINT:  pneumomediastinum   History of Present Illness:  Edward Cervantes is a 23 year old gentleman with a history of allergies and asthma who presented with chest tightness, shortness of breath, nasal congestion, mucus and a nonproductive cough.  He has seasonal allergies and is feels his allergies have been worse recently since he had to sleep in a field.  He is in the [employer]  He has a history of childhood asthma requiring inhalers, but never required hospitalization. He has been off inhalers and has been fine for years.  His allergy medicines Flonase  and Zyrtec  have not provided relief.  In the emergency department he underwent CT scanning showing pneumomediastinum.  PCCM consulted for management.  Pertinent  Medical History  Childhood asthma Allergies Vaping  Significant Hospital Events: Including procedures, antibiotic start and stop dates in addition to other pertinent events   5/3 admitted  Interim History / Subjective:  Pain improved, breathing is better. Coughing less.   Objective   Blood pressure 112/65, pulse (!) 53, temperature 97.7 F (36.5 C), temperature source Oral, resp. rate 12, height 6\' 2"  (1.88 m), weight 74.8 kg, SpO2 98%.    FiO2 (%):  [28 %] 28 %   Intake/Output Summary (Last 24 hours) at 05/08/2023 1020 Last data filed at 05/07/2023 2300 Gross per 24 hour  Intake 50 ml  Output --  Net 50 ml   Filed Weights   05/07/23 0052  Weight: 74.8 kg    Examination: General: fatigued appearing young man lying in bed in NAD HENT: [facility]/AT, eyes anicteric Lungs: breathing comfortably on RA, no wheezing today, symmetric breath sounds Cardiovascular: S1S2, RRR Abdomen: soft, ND Extremities: no cyanosis or edema Neuro: lethargic, answering questions appropriately Derm: warm, dry, no rashes.  RVP+  rhinovirus   Resolved Hospital Problem list     Assessment & Plan:  Pneumomediastinum triggered by acute asthma exacerbation and coughing episodes.  Baseline mild intermittent asthma. Rhinovirus infection - Steroids - Con't bronchodilators- recommend discharge on ICS/LABA (low-dose), likely for about 3 months. Long-term not sure that he will require inhalers year-round, but would treat for a few months after this acute episode. -albuterol  PRN for breakthrough symptoms - Montelukast, Flonase , antihistamine; con't at discharge - Steroids-prednisone  40 mg daily for 5 days total - robitussin PRN for cough - Recommend outpatient follow-up with pulmonology to de-escalate inhalers over the next few months> appt requested.  Stable for discharge from pulmonary standpoint, d/w primary.  Best Practice (right click and "Reselect all SmartList Selections" daily)   Per primary  Labs   CBC: Recent Labs  Lab 05/07/23 0150 05/08/23 0530  WBC 5.7 9.5  HGB 15.2 13.7  HCT 46.9 42.3  MCV 90.0 90.0  PLT 238 259    Basic Metabolic Panel: Recent Labs  Lab 05/07/23 0150 05/08/23 0530  NA 139 139  K 3.6 4.0  CL 103 105  CO2 26 26  GLUCOSE 89 105*  BUN 9 11  CREATININE 1.20 0.83  CALCIUM 8.9 9.3   GFR: Estimated Creatinine Clearance: 146.4 mL/min (by C-G formula based on SCr of 0.83 mg/dL). Recent Labs  Lab 05/07/23 0150 05/08/23 0530  WBC 5.7 9.5    Liver Function Tests: Recent Labs  Lab 05/08/23 0530  AST 18  ALT 19  ALKPHOS 67  BILITOT 0.5  PROT 6.4*  ALBUMIN 3.4*  Critical care time: n/a     Joesph Mussel, DO 05/08/23 11:35 AM [facility]  For contact information, see Amion. If no response to pager, please call PCCM consult pager. After hours, 7PM- 7AM, please call Elink.

## 2023-05-10 LAB — CULTURE, RESPIRATORY W GRAM STAIN

## 2023-05-12 ENCOUNTER — Ambulatory Visit

## 2023-05-12 ENCOUNTER — Encounter: Payer: Self-pay | Admitting: Nurse Practitioner

## 2023-05-12 ENCOUNTER — Ambulatory Visit (INDEPENDENT_AMBULATORY_CARE_PROVIDER_SITE_OTHER): Admitting: Nurse Practitioner

## 2023-05-12 VITALS — BP 122/86 | HR 68 | Ht 74.0 in | Wt 165.4 lb

## 2023-05-12 DIAGNOSIS — J3089 Other allergic rhinitis: Secondary | ICD-10-CM

## 2023-05-12 DIAGNOSIS — J982 Interstitial emphysema: Secondary | ICD-10-CM

## 2023-05-12 DIAGNOSIS — K219 Gastro-esophageal reflux disease without esophagitis: Secondary | ICD-10-CM

## 2023-05-12 DIAGNOSIS — J4540 Moderate persistent asthma, uncomplicated: Secondary | ICD-10-CM | POA: Diagnosis not present

## 2023-05-12 MED ORDER — BUDESONIDE-FORMOTEROL FUMARATE 80-4.5 MCG/ACT IN AERO: 2.0000 | INHALATION_SPRAY | Freq: Two times a day (BID) | RESPIRATORY_TRACT | 5 refills | Status: AC

## 2023-05-12 NOTE — Patient Instructions (Addendum)
 Continue Albuterol  inhaler 2 puffs every 6 hours as needed for shortness of breath or wheezing. Notify if symptoms persist despite rescue inhaler/neb use.  Continue Symbicort 2 puffs Twice daily. Brush tongue and rinse mouth afterwards Continue zyrtec  1 daily  Continue singulair 1 tab daily  Continue flonase  nasal spray 2 sprays each nostril daily  Continue famotidine 1 tab Twice daily for the next 6 weeks then can gradually decrease use as long as not having any reflux symptoms Complete prednisone  as prescribed  Try to decrease use or stop using Tussionex cough syrup. You can use Delsym 2 tsp Twice daily as needed over the counter for cough. Let me know if things start feeling worse as you come off the steroids  We'll keep you on your Symbicort for the next 3 months and then reassess  Asthma action plan: START albuterol  up to four times a day for worsening shortness of breath, wheezing and cough. If you symptoms do not improve in 24-48 hours, contact us  for steroid course. If your symptoms rapidly worsen, you have trouble talking or extreme difficulty breathing, or you're not getting relief from your nebulizer/rescue, go to the emergency department.  Chest x ray today  Follow up in 3 months with Dr. Dione Franks, Dr. Marygrace Snellen, Dr. Diania Fortes (new pt 30 min slot). If symptoms do not improve or worsen, please contact office for sooner follow up or seek emergency care.

## 2023-05-12 NOTE — Progress Notes (Signed)
 @Patient  ID: Edward Cervantes, male    DOB: 2000/09/19, 23 y.o.   MRN: [id number]  Chief Complaint  Patient presents with   [facility] follow,    Referring provider: Danella Dunn, MD  HPI: 23 year old male, never smoker/former vaper followed for asthma.  Past medical history significant for allergic rhinitis, anaphylaxis associated with peanut allergy.  He was recently admitted from 05/07/2023 to 05/08/2023 for significant asthma exacerbation with associated pneumomediastinum triggered by coughing episodes.  He was also positive for rhinovirus.  He was treated with steroids and bronchodilators.  He was discharged home on low-dose Symbicort , montelukast , Flonase  and antihistamine.  Instructed to complete 40 mg prednisone  daily for 5 days and continue cough control regimen as needed.  TEST/EVENTS:  05/07/2023 CT chest: Pneumomediastinum extending into the neck.  Residual thymic tissue in anterior mediastinum.  No pathologically enlarged lymph nodes.  Thickening in the ethmoid air cells and bilateral maxillary sinuses.  Trace right pneumothorax.  Bronchitis with developing pneumonia and right lower lobe 05/07/2023 CXR: Soft tissue gas in low neck and supraclavicular region bilaterally with evidence of upper pneumomediastinum.   05/12/2023: Today-Hospital follow-up Discussed the use of AI scribe software for clinical note transcription with the patient, who gave verbal consent to proceed.  History of Present Illness   Edward Cervantes is a 23 year old male with asthma who presents for hospital follow up.   He is experiencing improvement in his breathing following a severe asthma exacerbation with significant coughing resulting in pneumomediastinum. Initially, he had shortness of breath upon waking following discharge, but this has improved, and he has not needed to use his rescue inhaler recently. He is currently using Symbicort , two puffs in the morning and two puffs in the evening, as  part of his routine medication regimen. He has 1-2 doses of prednisone  left.   He experiences occasional sharp chest pains, which he attributes to digestive issues. He is taking Pepcid  (famotidine ) for these symptoms with improvement. No current chest pain is reported. No issues with swallowing or voice hoarseness.   Sinus symptoms are stable.   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 Minimal overall.   No chest pain, wheezing, hemoptysis, fevers, chills.       Allergies  Allergen Reactions   Peanut-Containing Drug Products Anaphylaxis    Immunization History  Administered Date(s) Administered   PFIZER(Purple Top)SARS-COV-2 Vaccination 06/07/2019, 07/02/2019    Past Medical History:  Diagnosis Date   Asthma    Food allergy, peanut    Seasonal allergies     Tobacco History: Social History   Tobacco Use  Smoking Status Never  Smokeless Tobacco Never  Tobacco Comments   Quit vaping last week   Counseling given: Not Answered Tobacco comments: Quit vaping last week   Outpatient Medications Prior to Visit  Medication Sig Dispense Refill   albuterol  (VENTOLIN  HFA) 108 (90 Base) MCG/ACT inhaler Inhale 1-2 puffs into the lungs every 6 (six) hours as needed for wheezing or shortness of breath. 18 g 0   azithromycin  (ZITHROMAX  Z-PAK) 250 MG tablet Take 1 tablet (250 mg total) by mouth daily for 4 days. 2 tablet 0   cetirizine  (ZYRTEC ) 10 MG tablet TAKE 1 TABLET BY MOUTH EVERY DAY 30 tablet 2   chlorpheniramine-HYDROcodone (TUSSIONEX) 10-8 MG/5ML Take 2.5-5 mLs by mouth at bedtime as needed  for cough. 70 mL 0   EPINEPHrine  0.3 mg/0.3 mL IJ SOAJ injection Use as directed for life-threatening allergic reaction. 4 Device 3   famotidine  (PEPCID ) 20 MG tablet Take 1 tablet (20 mg total) by mouth 2 (two) times daily. 60 tablet 0    fluticasone  (FLONASE ) 50 MCG/ACT nasal spray Place 1 spray into both nostrils daily. 16 g 0   ibuprofen  (ADVIL ) 600 MG tablet Take 1 tablet (600 mg total) by mouth every 6 (six) hours as needed. 30 tablet 0   montelukast  (SINGULAIR ) 10 MG tablet Take 1 tablet (10 mg total) by mouth daily at 6 (six) AM. 30 tablet 0   predniSONE  (DELTASONE ) 20 MG tablet Take 2 tablets (40 mg total) by mouth daily with breakfast. 10 tablet 0   budesonide -formoterol  (SYMBICORT ) 80-4.5 MCG/ACT inhaler Inhale 2 puffs into the lungs in the morning. 10.2 each 0   No facility-administered medications prior to visit.     Review of Systems:   Constitutional: No weight loss or gain, night sweats, fevers, chills, fatigue, or lassitude. HEENT: No headaches, difficulty swallowing, tooth/dental problems, or sore throat. No sneezing, itching, ear ache, nasal congestion, or post nasal drip CV:  No chest pain, orthopnea, PND, swelling in lower extremities, anasarca, dizziness, palpitations, syncope Resp: +improved, minimal shortness of breath with exertion; minimal cough. No excess mucus or change in color of mucus. No hemoptysis. No wheezing.  No chest wall deformity GI:  No heartburn, indigestion GU: No dysuria, change in color of urine, urgency or frequency.  No flank pain, no hematuria  Skin: No rash, lesions, ulcerations MSK:  No joint pain or swelling.   Neuro: No dizziness or lightheadedness.  Psych: No depression or anxiety. Mood stable.     Physical Exam:  BP 122/86 (BP Location: Left Arm, Patient Position: Sitting)   Pulse 68   Ht 6\' 2"  (1.88 m)   Wt 165 lb 6.4 oz (75 kg)   SpO2 96%   BMI 21.24 kg/m   GEN: Pleasant, interactive, well-appearing; in no acute distress HEENT:  Normocephalic and atraumatic. PERRLA. Sclera white. Nasal turbinates pink, moist and patent bilaterally. No rhinorrhea present. Oropharynx pink and moist, without exudate or edema. No lesions, ulcerations, or postnasal drip.  NECK:   Supple w/ fair ROM. Thyroid symmetrical with no goiter or nodules palpated. No lymphadenopathy.   CV: RRR, no m/r/g, no peripheral edema. Pulses intact, +2 bilaterally. No cyanosis, pallor or clubbing. PULMONARY:  Unlabored, regular breathing. Clear bilaterally A&P w/o wheezes/rales/rhonchi. No accessory muscle use.  GI: BS present and normoactive. Soft, non-tender to palpation.  MSK: No erythema, warmth or tenderness. Cap refil <2 sec all extrem.  Neuro: A/Ox3. No focal deficits noted.   Skin: Warm, no lesions or rashe Psych: Normal affect and behavior. Judgement and thought content appropriate.     Lab Results:  CBC    Component Value Date/Time   WBC 9.5 05/08/2023 0530   RBC 4.70 05/08/2023 0530   HGB 13.7 05/08/2023 0530   HCT 42.3 05/08/2023 0530   PLT 259 05/08/2023 0530   MCV 90.0 05/08/2023 0530   MCH 29.1 05/08/2023 0530   MCHC 32.4 05/08/2023 0530   RDW 12.5 05/08/2023 0530    BMET    Component Value Date/Time   NA 139 05/08/2023 0530   K 4.0 05/08/2023 0530   CL 105 05/08/2023 0530   CO2 26 05/08/2023 0530   GLUCOSE 105 (H) 05/08/2023 0530   BUN 11 05/08/2023 0530   CREATININE 0.83  05/08/2023 0530   CALCIUM 9.3 05/08/2023 0530   GFRNONAA >60 05/08/2023 0530    BNP No results found for: "BNP"   Imaging:  DG Chest 2 View Result Date: 05/13/2023 CLINICAL DATA:  23 year old male with pneumomediastinum EXAM: CHEST - 2 VIEW COMPARISON:  05/07/2023 FINDINGS: Cardiomediastinal silhouette within normal limits in size and contour. Interval resolution of mild fascial/subcutaneous gas overlying the neck. No pneumothorax or pleural effusion.  No confluent airspace disease. No displaced fracture IMPRESSION: Negative for acute cardiopulmonary disease. Interval resolution of myofacial gas. Electronically Signed   By: Myrlene Asper D.O.   On: 05/13/2023 14:41   DG Chest 2 View Result Date: 05/07/2023 CLINICAL DATA:  Shortness of breath and chest pain. EXAM: CHEST - 2 VIEW  COMPARISON:  05/07/2023 FINDINGS: The cardiopericardial silhouette is within normal limits for size. Soft tissue gas identified in the low neck and supraclavicular region bilaterally with evidence of upper pneumomediastinum. The tiny right apical pneumothorax seen on CT scan earlier today is not readily evident by x-ray. Telemetry leads overlie the chest. IMPRESSION: 1. Soft tissue gas in the low neck and supraclavicular region bilaterally with evidence of upper pneumomediastinum. 2. The tiny right apical pneumothorax seen on CT scan earlier today is not readily evident by x-ray. Electronically Signed   By: Donnal Fusi M.D.   On: 05/07/2023 06:37   CT Chest W Contrast Result Date: 05/07/2023 CLINICAL DATA:  Subcutaneous air in neck. Chest tightness and pressure. Shortness of breath. EXAM: CT CHEST WITH CONTRAST TECHNIQUE: Multidetector CT imaging of the chest was performed during intravenous contrast administration. RADIATION DOSE REDUCTION: This exam was performed according to the departmental dose-optimization program which includes automated exposure control, adjustment of the mA and/or kV according to patient size and/or use of iterative reconstruction technique. CONTRAST:  OMNIPAQUE  IOHEXOL  300 MG/ML  SOLN COMPARISON:  Same day chest radiograph FINDINGS: Cardiovascular: Normal heart size. No pericardial effusion. Normal caliber thoracic aorta without dissection. Mediastinum/Nodes: Pneumomediastinum extending into the neck. No mediastinal fluid or abscess. Trachea and esophagus are unremarkable. No pathologically enlarged lymph nodes. Residual thymic tissue in the anterior mediastinum. Lungs/Pleura: Trace right pneumothorax. No evidence of tension physiology. Diffuse bronchial wall thickening with mucous plugging in the lower lobes. Patchy ground-glass opacities in the right lower lobe. No pleural effusion. Upper Abdomen: No acute abnormality. Musculoskeletal: No acute fracture. Skull base and face:  Mucosal thickening in the ethmoid air cells and bilateral maxillary sinuses. IMPRESSION: 1. Trace right pneumothorax. No evidence of tension physiology. 2. Pneumomediastinum extending into the neck. No mediastinal fluid or abscess. 3. Bronchitis with developing pneumonia in the right lower lobe. 4. Mucosal thickening in the visualized ethmoid air cells and maxillary sinuses. Correlate for acute sinusitis. Electronically Signed   By: Rozell Cornet M.D.   On: 05/07/2023 03:18   DG Chest 2 View Result Date: 05/07/2023 CLINICAL DATA:  Chest pain EXAM: CHEST - 2 VIEW COMPARISON:  11/28/2021 FINDINGS: Cardiac shadow is within normal limits. The lungs are well aerated bilaterally. No focal infiltrate, effusion or pneumothorax is seen. Air is noted along the inferior aspect of the neck consistent with some subcutaneous emphysema. IMPRESSION: Subcutaneous emphysema in the neck. CT of the chest may be helpful for further evaluation. Electronically Signed   By: Violeta Grey M.D.   On: 05/07/2023 01:20    Administration History     None           No data to display  No results found for: "NITRICOXIDE"      Assessment & Plan:   Moderate persistent asthma Asthmatic, previously mild intermittent with no scheduled bronchodilator therapy. Severe exacerbation recently with significant coughing resulting in pneumomediastinum requiring hospitalization. Clinically improved. He is now on allergy regimen and scheduled low dose ICS/LABA therapy. He will remain on this for a minimum of 3 months and then we can reassess to determine if he can be tapered off. Trigger prevention reviewed. Closely monitor as he tapers off steroids. Try to discontinue use of narcotic cough syrup and transition to OTC. Advised to call if he has worsening symptoms. Continue GERD management. If he were to have another exacerbation or remain poorly managed, recommend PFTs. Action plan in place.   Patient Instructions   Continue Albuterol  inhaler 2 puffs every 6 hours as needed for shortness of breath or wheezing. Notify if symptoms persist despite rescue inhaler/neb use.  Continue Symbicort  2 puffs Twice daily. Brush tongue and rinse mouth afterwards Continue zyrtec  1 daily  Continue singulair  1 tab daily  Continue flonase  nasal spray 2 sprays each nostril daily  Continue famotidine  1 tab Twice daily for the next 6 weeks then can gradually decrease use as long as not having any reflux symptoms Complete prednisone  as prescribed  Try to decrease use or stop using Tussionex cough syrup. You can use Delsym  2 tsp Twice daily as needed over the counter for cough. Let me know if things start feeling worse as you come off the steroids  We'll keep you on your Symbicort  for the next 3 months and then reassess  Asthma action plan: START albuterol  up to four times a day for worsening shortness of breath, wheezing and cough. If you symptoms do not improve in 24-48 hours, contact us  for steroid course. If your symptoms rapidly worsen, you have trouble talking or extreme difficulty breathing, or you're not getting relief from your nebulizer/rescue, go to the emergency department.  Chest x ray today  Follow up in 3 months with Dr. Dione Franks, Dr. Marygrace Snellen, Dr. Diania Fortes (new pt 30 min slot). If symptoms do not improve or worsen, please contact office for sooner follow up or seek emergency care.    Pneumomediastinum (HCC) CXR today to ensure resolution. See above  Allergic rhinitis See above  GERD (gastroesophageal reflux disease) Hx of GERD symptoms. Possible this could exacerbate his asthma. Stable on famotidine . GERD precautions reviewed.    Advised if symptoms do not improve or worsen, to please contact office for sooner follow up or seek emergency care.   I spent 35 minutes of dedicated to the care of this patient on the date of this encounter to include pre-visit review of records, face-to-face time with the  patient discussing conditions above, post visit ordering of testing, clinical documentation with the electronic health record, making appropriate referrals as documented, and communicating necessary findings to members of the patients care team.  Roetta Clarke, NP 05/13/2023  Pt aware and understands NP's role.

## 2023-05-13 ENCOUNTER — Encounter: Payer: Self-pay | Admitting: Nurse Practitioner

## 2023-05-13 DIAGNOSIS — K219 Gastro-esophageal reflux disease without esophagitis: Secondary | ICD-10-CM | POA: Insufficient documentation

## 2023-05-13 NOTE — Assessment & Plan Note (Signed)
 See above

## 2023-05-13 NOTE — Assessment & Plan Note (Addendum)
 Asthmatic, previously mild intermittent with no scheduled bronchodilator therapy. Severe exacerbation recently with significant coughing resulting in pneumomediastinum requiring hospitalization. Clinically improved. He is now on allergy regimen and scheduled low dose ICS/LABA therapy. He will remain on this for a minimum of 3 months and then we can reassess to determine if he can be tapered off. Trigger prevention reviewed. Closely monitor as he tapers off steroids. Try to discontinue use of narcotic cough syrup and transition to OTC. Advised to call if he has worsening symptoms. Continue GERD management. If he were to have another exacerbation or remain poorly managed, recommend PFTs. Action plan in place.   Patient Instructions  Continue Albuterol  inhaler 2 puffs every 6 hours as needed for shortness of breath or wheezing. Notify if symptoms persist despite rescue inhaler/neb use.  Continue Symbicort  2 puffs Twice daily. Brush tongue and rinse mouth afterwards Continue zyrtec  1 daily  Continue singulair  1 tab daily  Continue flonase  nasal spray 2 sprays each nostril daily  Continue famotidine  1 tab Twice daily for the next 6 weeks then can gradually decrease use as long as not having any reflux symptoms Complete prednisone  as prescribed  Try to decrease use or stop using Tussionex cough syrup. You can use Delsym  2 tsp Twice daily as needed over the counter for cough. Let me know if things start feeling worse as you come off the steroids  We'll keep you on your Symbicort  for the next 3 months and then reassess  Asthma action plan: START albuterol  up to four times a day for worsening shortness of breath, wheezing and cough. If you symptoms do not improve in 24-48 hours, contact us  for steroid course. If your symptoms rapidly worsen, you have trouble talking or extreme difficulty breathing, or you're not getting relief from your nebulizer/rescue, go to the emergency department.  Chest x ray  today  Follow up in 3 months with Dr. Dione Franks, Dr. Marygrace Snellen, Dr. Diania Fortes (new pt 30 min slot). If symptoms do not improve or worsen, please contact office for sooner follow up or seek emergency care.

## 2023-05-13 NOTE — Assessment & Plan Note (Signed)
 CXR today to ensure resolution. See above

## 2023-05-13 NOTE — Assessment & Plan Note (Addendum)
 Hx of GERD symptoms. Possible this could exacerbate his asthma. Stable on famotidine . GERD precautions reviewed.

## 2023-06-20 ENCOUNTER — Emergency Department (HOSPITAL_COMMUNITY)
Admission: EM | Admit: 2023-06-20 | Discharge: 2023-06-20 | Disposition: A | Discharge status: Home / Self Care | Attending: Emergency Medicine | Admitting: Emergency Medicine

## 2023-06-20 ENCOUNTER — Other Ambulatory Visit: Payer: Self-pay

## 2023-06-20 ENCOUNTER — Encounter (HOSPITAL_COMMUNITY): Payer: Self-pay

## 2023-06-20 DIAGNOSIS — H5712 Ocular pain, left eye: Secondary | ICD-10-CM | POA: Diagnosis present

## 2023-06-20 DIAGNOSIS — Z91010 Allergy to peanuts: Secondary | ICD-10-CM | POA: Diagnosis not present

## 2023-06-20 DIAGNOSIS — B308 Other viral conjunctivitis: Secondary | ICD-10-CM | POA: Insufficient documentation

## 2023-06-20 DIAGNOSIS — B309 Viral conjunctivitis, unspecified: Secondary | ICD-10-CM

## 2023-06-20 MED ORDER — ERYTHROMYCIN 5 MG/GM OP OINT
TOPICAL_OINTMENT | Freq: Once | OPHTHALMIC | Status: AC
  Administered 2023-06-20: 1 via OPHTHALMIC
  Filled 2023-06-20: qty 3.5

## 2023-06-20 MED ORDER — NAPHAZOLINE-PHENIRAMINE 0.025-0.3 % OP SOLN
1.0000 [drp] | Freq: Four times a day (QID) | OPHTHALMIC | Status: DC | PRN
  Administered 2023-06-20: 1 [drp] via OPHTHALMIC
  Filled 2023-06-20: qty 15

## 2023-06-20 NOTE — ED Triage Notes (Signed)
 Patient said his left eye has been red for 3 days. It feels like something is stuck in it. No changes in vision.

## 2023-06-20 NOTE — ED Notes (Signed)
 Visual Acuity Screening  Left/Right eyes: 20/25  Both eyes: 20/15

## 2023-06-20 NOTE — Discharge Instructions (Addendum)
 It was a pleasure taking care of you today.  Based on your history, physical exam today the most likely diagnosis is viral conjunctivitis.  Today you have been prescribed 2 different medications, 1 is an ophthalmic ointment and the other is eyedrops.  Please use as prescribed for 7 days.  If symptoms persist or worsen, please return to the emergency department or seek further medical care.  At home please continue to monitor your symptoms, and if any of the following symptoms included but not limited to fever, chills, redness surrounding eye on skin, visual disturbances, severe pain develop please return the emergency department or seek further medical care.

## 2023-06-20 NOTE — ED Provider Notes (Signed)
 [place] EMERGENCY DEPARTMENT AT [facility] Provider Note   CSN: [id number] Arrival date & time: 06/20/23  1221     Patient presents with: Eye Pain   Edward Cervantes is a 23 y.o. male who presents to the emergency department with a chief complaint of left eye redness.  Patient states that on Friday he woke up and noticed that his left eye was slightly painful.  Patient states that he went to the bathroom and used water to attempt to wash it out.  Patient states that over the weekend the pain worsened and that his left eye became more red and irritated.  Patient denies contacts.  Patient denies known foreign body exposure, denies any known instance of something scratching his eye, denies welding.  Patient states he does have a past medical history significant for seasonal allergies.  Denies vision changes, denies fever, chills, denies pain or redness surrounding eye.    Eye Pain Pertinent negatives include no chest pain, no abdominal pain, no headaches and no shortness of breath.       Prior to Admission medications   Medication Sig Start Date End Date Taking? Authorizing Provider  albuterol  (VENTOLIN  HFA) 108 (90 Base) MCG/ACT inhaler Inhale 1-2 puffs into the lungs every 6 (six) hours as needed for wheezing or shortness of breath. 05/07/23   Darlis Eisenmenger, PA-C  budesonide -formoterol  (SYMBICORT ) 80-4.5 MCG/ACT inhaler Inhale 2 puffs into the lungs in the morning and at bedtime. 05/12/23   Cobb, Mariah Shines, NP  cetirizine  (ZYRTEC ) 10 MG tablet TAKE 1 TABLET BY MOUTH EVERY DAY 12/04/18   Kozlow, Eric J, MD  chlorpheniramine-HYDROcodone (TUSSIONEX) 10-8 MG/5ML Take 2.5-5 mLs by mouth at bedtime as needed for cough. 05/08/23   Vann, Jessica U, DO  EPINEPHrine  0.3 mg/0.3 mL IJ SOAJ injection Use as directed for life-threatening allergic reaction. 11/08/17   Kozlow, Rema Care, MD  famotidine  (PEPCID ) 20 MG tablet Take 1 tablet (20 mg total) by mouth 2 (two) times daily. 05/08/23    Vann, Jessica U, DO  fluticasone  (FLONASE ) 50 MCG/ACT nasal spray Place 1 spray into both nostrils daily. 02/01/22   Lamptey, Donley Furth, MD  ibuprofen  (ADVIL ) 600 MG tablet Take 1 tablet (600 mg total) by mouth every 6 (six) hours as needed. 02/01/22   Corine Dice, MD  montelukast  (SINGULAIR ) 10 MG tablet Take 1 tablet (10 mg total) by mouth daily at 6 (six) AM. 05/09/23   Enrigue Harvard, DO  predniSONE  (DELTASONE ) 20 MG tablet Take 2 tablets (40 mg total) by mouth daily with breakfast. 05/09/23   Enrigue Harvard, DO    Allergies: Peanut-containing drug products    Review of Systems  Constitutional:  Negative for activity change, appetite change, chills and fever.  HENT:  Positive for congestion. Negative for ear discharge, ear pain, facial swelling and hearing loss.   Eyes:  Positive for photophobia, pain, discharge (watery), redness and itching. Negative for visual disturbance.  Respiratory:  Negative for cough, choking, chest tightness, shortness of breath and wheezing.   Cardiovascular:  Negative for chest pain and palpitations.  Gastrointestinal:  Negative for abdominal distention, abdominal pain, constipation, diarrhea, nausea and vomiting.  Musculoskeletal:  Negative for gait problem, neck pain and neck stiffness.  Skin:  Negative for rash and wound.  Neurological:  Negative for dizziness, seizures, syncope, weakness, light-headedness and headaches.    Updated Vital Signs BP 125/78   Pulse 66   Temp 98.9 F (37.2 C) (Oral)  Resp 18   Ht 6' 2 (1.88 m)   Wt 74.8 kg   SpO2 99%   BMI 21.18 kg/m   Physical Exam Vitals and nursing note reviewed.  Constitutional:      General: He is awake. He is not in acute distress.    Appearance: Normal appearance. He is not ill-appearing, toxic-appearing or diaphoretic.  HENT:     Head: Normocephalic and atraumatic.   Eyes:     General: Lids are everted, no foreign bodies appreciated. Vision grossly intact. Gaze aligned appropriately.  No visual field deficit or scleral icterus.       Right eye: No foreign body, discharge or hordeolum.        Left eye: Discharge (watery discharge present) present.No foreign body or hordeolum.     Extraocular Movements: Extraocular movements intact.     Right eye: Normal extraocular motion and no nystagmus.     Left eye: Normal extraocular motion and no nystagmus.     Conjunctiva/sclera:     Right eye: Right conjunctiva is not injected.     Left eye: Left conjunctiva is injected.     Pupils: Pupils are equal, round, and reactive to light.    Cardiovascular:     Rate and Rhythm: Normal rate and regular rhythm.  Pulmonary:     Effort: Pulmonary effort is normal. No respiratory distress.     Breath sounds: Normal breath sounds. No wheezing, rhonchi or rales.   Musculoskeletal:        General: Normal range of motion.     Cervical back: Normal range of motion. No tenderness.   Skin:    General: Skin is warm and dry.     Capillary Refill: Capillary refill takes less than 2 seconds.   Neurological:     General: No focal deficit present.     Mental Status: He is alert and oriented to person, place, and time.     Cranial Nerves: No cranial nerve deficit.     Sensory: No sensory deficit.     Motor: No weakness.     Coordination: Coordination normal.     Gait: Gait normal.   Psychiatric:        Mood and Affect: Mood normal.        Behavior: Behavior normal. Behavior is cooperative.     (all labs ordered are listed, but only abnormal results are displayed) Labs Reviewed - No data to display  EKG: None  Radiology: No results found.   Procedures   Medications Ordered in the ED  erythromycin ophthalmic ointment (1 Application Left Eye Given 06/20/23 1343)                                    Medical Decision Making Risk Prescription drug management.    Patient presents to the ED for concern of left eye redness, this involves an extensive number of treatment options,  and is a complaint that carries with it a high risk of complications and morbidity.  The differential diagnosis includes viral conjunctivitis, bacterial conjunctivitis, orbital cellulitis, etc.   Co morbidities that complicate the patient evaluation  None   Medicines ordered and prescription drug management:  I ordered medication including erythromycin ophthalmic ointment, naphazoline-pheniramine ophthalmic solution for suspected conjunctivitis  Reevaluation of the patient after these medicines showed that the patient stayed the same I have reviewed the patients home medicines and have made adjustments as needed  Test Considered:  Imaging declined at this time: EOMs intact, no sign of orbital cellulitis, PERRL, vision grossly intact, no red flag symptoms    Critical Interventions:  none   Problem List / ED Course:  Patient presents emergency department with a chief complaint of left eye redness.  Patient states that as symptoms began on Friday, denies known foreign body exposure, vision grossly intact, EOMs intact, pupils PERRL, denies glasses or contact use Physical exam consistent with viral versus bacterial conjunctivitis Due to the absence of red flag symptoms, patient prescribed erythromycin ophthalmic ointment as well as naphazoline pheniramine ophthalmic solution drops Patient discharged with return precautions, instructed to continue these medications at home for eye redness/irritation as prescribed  Patient understands return precautions.   Reevaluation:  After the interventions noted above, I reevaluated the patient and found that they have :stayed the same   Social Determinants of Health:  none   Dispostion:  After consideration of the diagnostic results and the patients response to treatment, I feel that the patent would benefit from discharge and outpatient therapy with comic ointment as well as ophthalmic drops as prescribed.  Return precautions given.   Patient discharged.  Recommend follow-up with primary care provider as scheduled or sooner if symptoms persist or worsen.     Final diagnoses:  Acute viral conjunctivitis of left eye    ED Discharge Orders     None          Susanne Epps 06/20/23 2250    Dorenda Gandy, MD 06/21/23 [id number]

## 2023-07-29 ENCOUNTER — Encounter: Payer: Self-pay | Admitting: Pulmonary Disease

## 2023-07-29 ENCOUNTER — Ambulatory Visit: Admitting: Pulmonary Disease
